# Patient Record
Sex: Female | Born: 1965 | Race: White | Hispanic: No | Marital: Married | Smoking: Never smoker
Health system: Southern US, Community
[De-identification: ages and names within clinical notes are randomized; demographics above are authoritative.]

## PROBLEM LIST (undated history)

## (undated) DIAGNOSIS — M009 Pyogenic arthritis, unspecified: Secondary | ICD-10-CM

## (undated) DIAGNOSIS — M19042 Primary osteoarthritis, left hand: Secondary | ICD-10-CM

## (undated) DIAGNOSIS — R6889 Other general symptoms and signs: Secondary | ICD-10-CM

## (undated) DIAGNOSIS — T8859XA Other complications of anesthesia, initial encounter: Secondary | ICD-10-CM

## (undated) DIAGNOSIS — T4145XA Adverse effect of unspecified anesthetic, initial encounter: Secondary | ICD-10-CM

## (undated) DIAGNOSIS — K219 Gastro-esophageal reflux disease without esophagitis: Secondary | ICD-10-CM

## (undated) DIAGNOSIS — M436 Torticollis: Secondary | ICD-10-CM

## (undated) HISTORY — PX: OTHER SURGICAL HISTORY: SHX169

---

## 1999-07-23 ENCOUNTER — Encounter (INDEPENDENT_AMBULATORY_CARE_PROVIDER_SITE_OTHER): Payer: Self-pay | Admitting: Specialist

## 1999-07-23 ENCOUNTER — Other Ambulatory Visit: Admission: RE | Admit: 1999-07-23 | Discharge: 1999-07-23 | Payer: Self-pay | Admitting: Obstetrics and Gynecology

## 2001-02-09 ENCOUNTER — Other Ambulatory Visit: Admission: RE | Admit: 2001-02-09 | Discharge: 2001-02-09 | Payer: Self-pay | Admitting: Obstetrics and Gynecology

## 2001-02-23 ENCOUNTER — Encounter: Payer: Self-pay | Admitting: Obstetrics and Gynecology

## 2001-02-23 ENCOUNTER — Ambulatory Visit (HOSPITAL_COMMUNITY): Admission: RE | Admit: 2001-02-23 | Discharge: 2001-02-23 | Payer: Self-pay | Admitting: Obstetrics and Gynecology

## 2001-04-21 ENCOUNTER — Encounter (INDEPENDENT_AMBULATORY_CARE_PROVIDER_SITE_OTHER): Payer: Self-pay

## 2001-04-21 ENCOUNTER — Ambulatory Visit (HOSPITAL_COMMUNITY): Admission: RE | Admit: 2001-04-21 | Discharge: 2001-04-21 | Payer: Self-pay | Admitting: Obstetrics and Gynecology

## 2002-02-24 ENCOUNTER — Other Ambulatory Visit: Admission: RE | Admit: 2002-02-24 | Discharge: 2002-02-24 | Payer: Self-pay | Admitting: Obstetrics and Gynecology

## 2003-02-27 ENCOUNTER — Other Ambulatory Visit: Admission: RE | Admit: 2003-02-27 | Discharge: 2003-02-27 | Payer: Self-pay | Admitting: Obstetrics and Gynecology

## 2004-03-07 ENCOUNTER — Other Ambulatory Visit: Admission: RE | Admit: 2004-03-07 | Discharge: 2004-03-07 | Payer: Self-pay | Admitting: Obstetrics and Gynecology

## 2004-03-19 ENCOUNTER — Ambulatory Visit (HOSPITAL_COMMUNITY): Admission: RE | Admit: 2004-03-19 | Discharge: 2004-03-19 | Payer: Self-pay | Admitting: Obstetrics and Gynecology

## 2005-03-17 ENCOUNTER — Other Ambulatory Visit: Admission: RE | Admit: 2005-03-17 | Discharge: 2005-03-17 | Payer: Self-pay | Admitting: Obstetrics and Gynecology

## 2005-03-20 ENCOUNTER — Ambulatory Visit (HOSPITAL_COMMUNITY): Admission: RE | Admit: 2005-03-20 | Discharge: 2005-03-20 | Payer: Self-pay | Admitting: Obstetrics and Gynecology

## 2005-05-09 ENCOUNTER — Encounter: Admission: RE | Admit: 2005-05-09 | Discharge: 2005-05-09 | Payer: Self-pay | Admitting: Obstetrics and Gynecology

## 2006-06-04 ENCOUNTER — Other Ambulatory Visit: Admission: RE | Admit: 2006-06-04 | Discharge: 2006-06-04 | Payer: Self-pay | Admitting: Obstetrics and Gynecology

## 2006-06-08 ENCOUNTER — Encounter: Admission: RE | Admit: 2006-06-08 | Discharge: 2006-06-08 | Payer: Self-pay | Admitting: Obstetrics and Gynecology

## 2007-06-11 ENCOUNTER — Encounter: Admission: RE | Admit: 2007-06-11 | Discharge: 2007-06-11 | Payer: Self-pay | Admitting: Obstetrics and Gynecology

## 2007-09-02 ENCOUNTER — Other Ambulatory Visit: Admission: RE | Admit: 2007-09-02 | Discharge: 2007-09-02 | Payer: Self-pay | Admitting: Obstetrics and Gynecology

## 2008-06-16 ENCOUNTER — Encounter: Admission: RE | Admit: 2008-06-16 | Discharge: 2008-06-16 | Payer: Self-pay | Admitting: Obstetrics and Gynecology

## 2008-09-05 ENCOUNTER — Other Ambulatory Visit: Admission: RE | Admit: 2008-09-05 | Discharge: 2008-09-05 | Payer: Self-pay | Admitting: Obstetrics and Gynecology

## 2009-07-09 ENCOUNTER — Encounter: Admission: RE | Admit: 2009-07-09 | Discharge: 2009-07-09 | Payer: Self-pay | Admitting: Obstetrics and Gynecology

## 2009-10-08 ENCOUNTER — Other Ambulatory Visit: Admission: RE | Admit: 2009-10-08 | Discharge: 2009-10-08 | Payer: Self-pay | Admitting: Obstetrics and Gynecology

## 2010-07-22 ENCOUNTER — Encounter: Admission: RE | Admit: 2010-07-22 | Discharge: 2010-07-22 | Payer: Self-pay | Admitting: Obstetrics and Gynecology

## 2011-03-14 NOTE — Op Note (Signed)
[facility]  Patient:    Ariana Baird, Ariana Baird                         MRN: [id number] Proc. Date: 04/21/01 Attending:  Beather Arbour. Thomasena Edis, M.D. CC:         [facility] Gynecology, [address]   Operative Report  PREOPERATIVE DIAGNOSIS:       Endometrial polyp found on sonohistogram.  POSTOPERATIVE DIAGNOSIS:      Endometrial polyp found on sonohistogram  OPERATION:                    Dilatation and curettage, hysteroscopy.  SURGEON:                      Beather Arbour. Thomasena Edis, M.D., Ph.D.  ANESTHESIA:                   MAC plus 10 cc 1% lidocaine paracervical block.  FLUIDS:                       Approximately 1000 cc of crystalloid.  DRAINS:                       None.  COMPLICATIONS:                None.   DESCRIPTION OF PROCEDURE:     The patient was brought to the operating room and identified on the operating table.  After the patient was adequately sedated, she was placed in the dorsolithotomy position and prepped and draped in the usual sterile fashion.  The bladder was straight catheterized for approximately  100 cc of clear yellow urine.  Examination under anesthesia revealed the uterus to be anteverted and mobile, approximately 6 to 8-week size.  There were no adnexal masses palpated.  A speculum was placed in the anterior lip of the cervix.  It was infiltrated with 1 cc of 1% lidocaine and grasped with a single-tooth tenaculum.  The remaining 9 cc of 1% lidocaine were infused for a paracervical block.  The uterus sounded to 7 cm.  The cervix was very gently dilated up to a #25 Pratt dilator.  Dilatation proceeded very carefully and gently to decrease the risk of uterine perforation.  Using the ACMI hysteroscope and using sorbitol as a distending medium, a careful and thorough hysteroscopic examination was performed.  The previously seen polyp on sonohistogram was again visualized.  Both tubal ostia were identified.  There was noted to be shaggy  endometrium as well.  At that point, the scope was withdrawn, and a careful and thorough curettage was performed with copious polypoid tissue obtained.  The Randall Stone forceps were placed, and additional tissue was obtained.  The hysteroscope was again placed, and there was noted to be one small additional area of polyp remaining, and this was subsequently removed in its entirety with the serrated curet and the Randall Stone forceps.  The hysteroscope was again placed, and the endometrium was noted to be curetted in its entirety with the polyp removed in its entirety.  The patient tolerated the procedure well without apparent complications.  She was transferred to the recovery room in stable condition after all instrument, sponge, and needle counts were correct.  She was given postoperative instruction sheet, urged to take ibuprofen 200 mg tablets every six hours as needed for pain, refrain from intercourse for  two weeks, and return to the office in two to three weeks for postoperative evaluation.  She is to call for any problems. DD:  04/21/01 TD:  04/21/01 Job: [id number]

## 2011-03-14 NOTE — Op Note (Signed)
[facility]  Patient:    Ariana Baird                          MRN: [id number] Proc. Date: 04/21/01 Attending:  Beather Arbour. Thomasena Edis, M.D. CC:         [facility] Gynecology, [address]   Operative Report  PREOPERATIVE DIAGNOSIS:       Endometrial polyp found on sonohistogram.  POSTOPERATIVE DIAGNOSIS:      Endometrial polyp found on sonohistogram  OPERATION:                    Dilatation and curettage, hysteroscopy.  SURGEON:                      Beather Arbour. Thomasena Edis, M.D., Ph.D.  ANESTHESIA:                   MAC plus 10 cc 1% lidocaine paracervical block.  FLUIDS:                       Approximately 1000 cc of crystalloid.  DRAINS:                       None.  COMPLICATIONS:                None.   DESCRIPTION OF PROCEDURE:     The patient was brought to the operating room and identified on the operating table.  After the patient was adequately sedated, she was placed in the dorsolithotomy position and prepped and draped in the usual sterile fashion.  The bladder was straight catheterized for approximately  100 cc of clear yellow urine.  Examination under anesthesia revealed the uterus to be anteverted and mobile, approximately 6 to 8-week size.  There were no adnexal masses palpated.  A speculum was placed in the anterior lip of the cervix.  It was infiltrated with 1 cc of 1% lidocaine and grasped with a single-tooth tenaculum.  The remaining 9 cc of 1% lidocaine were infused for a paracervical block.  The uterus sounded to 7 cm.  The cervix was very gently dilated up to a #25 Pratt dilator.  Dilatation proceeded very carefully and gently to decrease the risk of uterine perforation.  Using the ACMI hysteroscope and using sorbitol as a distending medium, a careful and thorough hysteroscopic examination was performed.  The previously seen polyp on sonohistogram was again visualized.  Both tubal ostia were identified.  There was noted to be shaggy  endometrium as well.  At that point, the scope was withdrawn, and a careful and thorough curettage was performed with copious polypoid tissue obtained.  The Randall Stone forceps were placed, and additional tissue was obtained.  The hysteroscope was again placed, and there was noted to be one small additional area of polyp remaining, and this was subsequently removed in its entirety with the serrated curet and the Randall Stone forceps.  The hysteroscope was again placed, and the endometrium was noted to be curetted in its entirety with the polyp removed in its entirety.  The patient tolerated the procedure well without apparent complications.  She was transferred to the recovery room in stable condition after all instrument, sponge, and needle counts were correct.  She was given postoperative instruction sheet, urged to take ibuprofen 200 mg tablets every six hours as needed for pain, refrain from  intercourse for two weeks, and return to the office in two to three weeks for postoperative evaluation.  She is to call for any problems. DD:  04/21/01 TD:  04/21/01 Job: [id number]

## 2011-03-14 NOTE — H&P (Signed)
[facility]  Patient:    Ariana Baird, Ariana Baird                         MRN: [id number] Attending:  Beather Arbour. Thomasena Edis, M.D. CC:         [facility] Gynecology, [address]  Preop Area, [facility] for surgery April 21, 2001.   History and Physical  DATE OF BIRTH:                01/07/1966  SOCIAL SECURITY NUMBER:       [id number]  HISTORY OF PRESENT ILLNESS:   The patient is a 45 year old gravida 4, para 27, Caucasian female who was seen for annual examination February 09, 2001.  She was complaining at that time of some left lower quadrant pain.  PAST MEDICAL HISTORY:         Significant for preterm delivery at 24 weeks with subsequent demise of the infant. DD:  04/21/01 TD:  04/21/01 Job: [id number]

## 2011-07-31 ENCOUNTER — Other Ambulatory Visit: Payer: Self-pay | Admitting: Obstetrics & Gynecology

## 2011-07-31 DIAGNOSIS — Z1231 Encounter for screening mammogram for malignant neoplasm of breast: Secondary | ICD-10-CM

## 2011-08-14 ENCOUNTER — Ambulatory Visit
Admission: RE | Admit: 2011-08-14 | Discharge: 2011-08-14 | Disposition: A | Discharge status: Home / Self Care | Payer: BC Managed Care – PPO | Source: Ambulatory Visit | Attending: Obstetrics & Gynecology | Admitting: Obstetrics & Gynecology

## 2011-08-14 DIAGNOSIS — Z1231 Encounter for screening mammogram for malignant neoplasm of breast: Secondary | ICD-10-CM

## 2013-06-23 ENCOUNTER — Other Ambulatory Visit: Payer: Self-pay

## 2013-06-23 DIAGNOSIS — Z1231 Encounter for screening mammogram for malignant neoplasm of breast: Secondary | ICD-10-CM

## 2013-07-13 ENCOUNTER — Ambulatory Visit
Admission: RE | Admit: 2013-07-13 | Discharge: 2013-07-13 | Disposition: A | Discharge status: Home / Self Care | Payer: BC Managed Care – PPO | Source: Ambulatory Visit

## 2013-07-13 DIAGNOSIS — Z1231 Encounter for screening mammogram for malignant neoplasm of breast: Secondary | ICD-10-CM

## 2014-06-21 ENCOUNTER — Other Ambulatory Visit: Payer: Self-pay

## 2014-06-21 DIAGNOSIS — Z1231 Encounter for screening mammogram for malignant neoplasm of breast: Secondary | ICD-10-CM

## 2014-07-14 ENCOUNTER — Ambulatory Visit
Admission: RE | Admit: 2014-07-14 | Discharge: 2014-07-14 | Disposition: A | Discharge status: Home / Self Care | Payer: BC Managed Care – PPO | Source: Ambulatory Visit

## 2014-07-14 DIAGNOSIS — Z1231 Encounter for screening mammogram for malignant neoplasm of breast: Secondary | ICD-10-CM

## 2015-09-12 ENCOUNTER — Other Ambulatory Visit: Payer: Self-pay

## 2015-09-12 DIAGNOSIS — Z1231 Encounter for screening mammogram for malignant neoplasm of breast: Secondary | ICD-10-CM

## 2015-09-29 ENCOUNTER — Emergency Department (HOSPITAL_BASED_OUTPATIENT_CLINIC_OR_DEPARTMENT_OTHER)
Admission: EM | Admit: 2015-09-29 | Discharge: 2015-09-29 | Disposition: A | Discharge status: Home / Self Care | Payer: BLUE CROSS/BLUE SHIELD | Attending: Emergency Medicine | Admitting: Emergency Medicine

## 2015-09-29 ENCOUNTER — Encounter (HOSPITAL_BASED_OUTPATIENT_CLINIC_OR_DEPARTMENT_OTHER): Payer: Self-pay | Admitting: Emergency Medicine

## 2015-09-29 DIAGNOSIS — Y9389 Activity, other specified: Secondary | ICD-10-CM | POA: Diagnosis not present

## 2015-09-29 DIAGNOSIS — S61211A Laceration without foreign body of left index finger without damage to nail, initial encounter: Secondary | ICD-10-CM | POA: Diagnosis present

## 2015-09-29 DIAGNOSIS — W268XXA Contact with other sharp object(s), not elsewhere classified, initial encounter: Secondary | ICD-10-CM | POA: Insufficient documentation

## 2015-09-29 DIAGNOSIS — S61219A Laceration without foreign body of unspecified finger without damage to nail, initial encounter: Secondary | ICD-10-CM

## 2015-09-29 DIAGNOSIS — Y9289 Other specified places as the place of occurrence of the external cause: Secondary | ICD-10-CM | POA: Diagnosis not present

## 2015-09-29 DIAGNOSIS — Y998 Other external cause status: Secondary | ICD-10-CM | POA: Insufficient documentation

## 2015-09-29 NOTE — ED Notes (Signed)
Patient washed finger after lacerating finger with soap and water

## 2015-09-29 NOTE — ED Notes (Signed)
Pt states she lacerated her left pointer finger with soup can lid

## 2015-09-29 NOTE — ED Provider Notes (Signed)
CSN: [id number]     Arrival date & time 09/29/15  1821 History   First MD Initiated Contact with Patient 09/29/15 1838     Chief Complaint  Patient presents with  . Laceration     (Consider location/radiation/quality/duration/timing/severity/associated sxs/prior Treatment) HPI  Ariana Baird is a 49 y.o. female  PCP: No primary care provider on file.  Blood pressure 167/100, pulse 90, temperature 99 F (37.2 C), temperature source Oral, resp. rate 18, height 5\' 6"  (1.676 m), weight 68.04 kg, last menstrual period 09/08/2015, SpO2 99 %.  SIGNIFICANT PMH: none CHIEF COMPLAINT: laceration  When: 3 pm today Mechanism: cut it on a soup can Chronicity: acute Location: left pointer finger Radiation: none Quality and severity: minor Treatments tried: pressure Alleviating factors: rest Worsening factors: movement Associated Symptoms: bleeding. Risk Factors: drinking alcohol.  Patient to the ER because she got a small cut on her finger, she came because she was concerned that it kept bleeding. She does not want stitches and informs that she plans to refuse them because otherwise she passes out.  Negative ROS: Confusion, diaphoresis, fever, headache, weakness (general or focal), change of vision,  neck pain, dysphagia, aphagia, chest pain, shortness of breath,  back pain, abdominal pains, nausea, vomiting, diarrhea, lower extremity swelling, rash.     History reviewed. No pertinent past medical history. History reviewed. No pertinent past surgical history. History reviewed. No pertinent family history. Social History  Substance Use Topics  . Smoking status: Never Smoker   . Smokeless tobacco: None  . Alcohol Use: Yes   OB History    No data available     Review of Systems  All other systems reviewed and are negative.     Allergies  Review of patient's allergies indicates no known allergies.  Home Medications   Prior to Admission medications   Not on File   BP  167/100 mmHg  Pulse 90  Temp(Src) 99 F (37.2 C) (Oral)  Resp 18  Ht 5\' 6"  (1.676 m)  Wt 68.04 kg  BMI 24.22 kg/m2  SpO2 99%  LMP 09/08/2015 Physical Exam  Constitutional: She appears well-developed and well-nourished. No distress.  HENT:  Head: Normocephalic and atraumatic.  Eyes: Pupils are equal, round, and reactive to light.  Neck: Normal range of motion. Neck supple.  Cardiovascular: Normal rate and regular rhythm.   Pulmonary/Chest: Effort normal.  Abdominal: Soft.  Neurological: She is alert.  Skin: Skin is warm and dry.  Small 0,5 cm laceration to posterior left pointer finger. It does not extend through the epidermis. NIV.  Nursing note and vitals reviewed.   ED Course  Procedures (including critical care time) Labs Review Labs Reviewed - No data to display  Imaging Review No results found. I have personally reviewed and evaluated these images and lab results as part of my medical decision-making.   EKG Interpretation None      MDM   Final diagnoses:  Laceration of finger of left hand, initial encounter    Patient does not want sutures, it is not through the entire epidermis. Explained to patient that the risk of non-suture is delayed healing, worse cosmesis, and that it may open due to being in an area of tension. She says that she prefers to not have a sutured stool. She is up-to-date on her tetanus. The wound was cleaned and Steri-Strips applied. The patient was placed in a finger splint to keep it in extension. Patient to follow-up with primary care doctor, given return precautions.  Medications - No data to display   I feel the patient has had an appropriate workup for their chief complaint at this time and likelihood of emergent condition existing is low. Discussed s/sx that warrant return to the ED.  Filed Vitals:   09/29/15 1827 09/29/15 1944  BP: 167/100 130/88  Pulse: 90 78  Temp: 99 F (37.2 C)   Resp: 18 [address],  PA-C 09/29/15 1948  Marily Memos, MD 09/30/15 1558

## 2015-09-29 NOTE — Discharge Instructions (Signed)
Nonsutured Laceration Care °A laceration is a cut that goes through all layers of the skin and extends into the tissue that is right under the skin. This type of cut is usually stitched up (sutured) or closed with tape (adhesive strips) or skin glue shortly after the injury happens. °However, if the wound is dirty or if several hours pass before medical treatment is provided, it is likely that germs (bacteria) will enter the wound. Closing a laceration after bacteria have entered it increases the risk of infection. In these cases, your health care provider may leave the laceration open (nonsutured) and cover it with a bandage. This type of treatment helps prevent infection and allows the wound to heal from the deepest layer of tissue damage up to the surface. °An open fracture is a type of injury that may involve nonsutured lacerations. An open fracture is a break in a bone that happens along with one or more lacerations through the skin that is near the fracture site. °HOW TO CARE FOR YOUR NONSUTURED LACERATION °· Take or apply over-the-counter and prescription medicines only as told by your health care provider. °· If you were prescribed an antibiotic medicine, take or apply it as told by your health care provider. Do not stop using the antibiotic even if your condition improves. °· Clean the wound one time each day or as told by your health care provider. °¨ Wash the wound with mild soap and water. °¨ Rinse the wound with water to remove all soap. °¨ Pat your wound dry with a clean towel. Do not rub the wound. °· Do not inject anything into the wound unless your health care provider told you to. °· Change any bandages (dressings) as told by your health care provider. This includes changing the dressing if it gets wet, dirty, or starts to smell bad. °· Keep the dressing dry until your health care provider says it can be removed. Do not take baths, swim, or do anything that puts your wound underwater until your  health care provider approves. °· Raise (elevate) the injured area above the level of your heart while you are sitting or lying down, if possible. °· Do not scratch or pick at the wound. °· Check your wound every day for signs of infection. Watch for: °¨ Redness, swelling, or pain. °¨ Fluid, blood, or pus. °· Keep all follow-up visits as told by your health care provider. This is important. °SEEK MEDICAL CARE IF: °· You received a tetanus and shot and you have swelling, severe pain, redness, or bleeding at the injection site.   °· You have a fever. °· Your pain is not controlled with medicine. °· You have increased redness, swelling, or pain at the site of your wound. °· You have fluid, blood, or pus coming from your wound. °· You notice a bad smell coming from your wound or your dressing. °· You notice something coming out of the wound, such as wood or glass. °· You notice a change in the color of your skin near your wound. °· You develop a new rash. °· You need to change the dressing frequently due to fluid, blood, or pus draining from the wound. °· You develop numbness around your wound. °SEEK IMMEDIATE MEDICAL CARE IF: °· Your pain suddenly increases and is severe. °· You develop severe swelling around the wound. °· The wound is on your hand or foot and you cannot properly move a finger or toe. °· The wound is on your hand or   foot and you notice that your fingers or toes look pale or bluish. °· You have a red streak going away from your wound. °  °This information is not intended to replace advice given to you by your health care provider. Make sure you discuss any questions you have with your health care provider. °  °Document Released: 09/10/2006 Document Revised: 02/27/2015 Document Reviewed: 10/09/2014 °Elsevier Interactive Patient Education ©2016 [employer] ° °

## 2015-10-03 ENCOUNTER — Other Ambulatory Visit: Payer: Self-pay | Admitting: Obstetrics and Gynecology

## 2015-10-03 ENCOUNTER — Ambulatory Visit
Admission: RE | Admit: 2015-10-03 | Discharge: 2015-10-03 | Disposition: A | Discharge status: Home / Self Care | Payer: BLUE CROSS/BLUE SHIELD | Source: Ambulatory Visit

## 2015-10-03 DIAGNOSIS — R928 Other abnormal and inconclusive findings on diagnostic imaging of breast: Secondary | ICD-10-CM

## 2015-10-03 DIAGNOSIS — Z1231 Encounter for screening mammogram for malignant neoplasm of breast: Secondary | ICD-10-CM

## 2015-10-10 ENCOUNTER — Ambulatory Visit
Admission: RE | Admit: 2015-10-10 | Discharge: 2015-10-10 | Disposition: A | Discharge status: Home / Self Care | Payer: BLUE CROSS/BLUE SHIELD | Source: Ambulatory Visit | Attending: Obstetrics and Gynecology | Admitting: Obstetrics and Gynecology

## 2015-10-10 DIAGNOSIS — R928 Other abnormal and inconclusive findings on diagnostic imaging of breast: Secondary | ICD-10-CM

## 2016-09-01 ENCOUNTER — Other Ambulatory Visit: Payer: Self-pay | Admitting: Obstetrics and Gynecology

## 2016-09-01 DIAGNOSIS — Z1231 Encounter for screening mammogram for malignant neoplasm of breast: Secondary | ICD-10-CM

## 2016-10-13 ENCOUNTER — Ambulatory Visit
Admission: RE | Admit: 2016-10-13 | Discharge: 2016-10-13 | Disposition: A | Discharge status: Home / Self Care | Payer: BLUE CROSS/BLUE SHIELD | Source: Ambulatory Visit | Attending: Obstetrics and Gynecology | Admitting: Obstetrics and Gynecology

## 2016-10-13 DIAGNOSIS — Z1231 Encounter for screening mammogram for malignant neoplasm of breast: Secondary | ICD-10-CM

## 2016-11-27 IMAGING — MG MM DIAG BREAST TOMO UNI LEFT
4 series · 4 of 12 positions shown · non-contrast
Comparison: Previous exam(s).

CLINICAL DATA: Left medial breast asymmetry seen on most recent
screening mammography.

EXAM:
DIGITAL DIAGNOSTIC LEFT MAMMOGRAM WITH 3D TOMOSYNTHESIS WITH CAD
ULTRASOUND LEFT BREAST

[L CC]
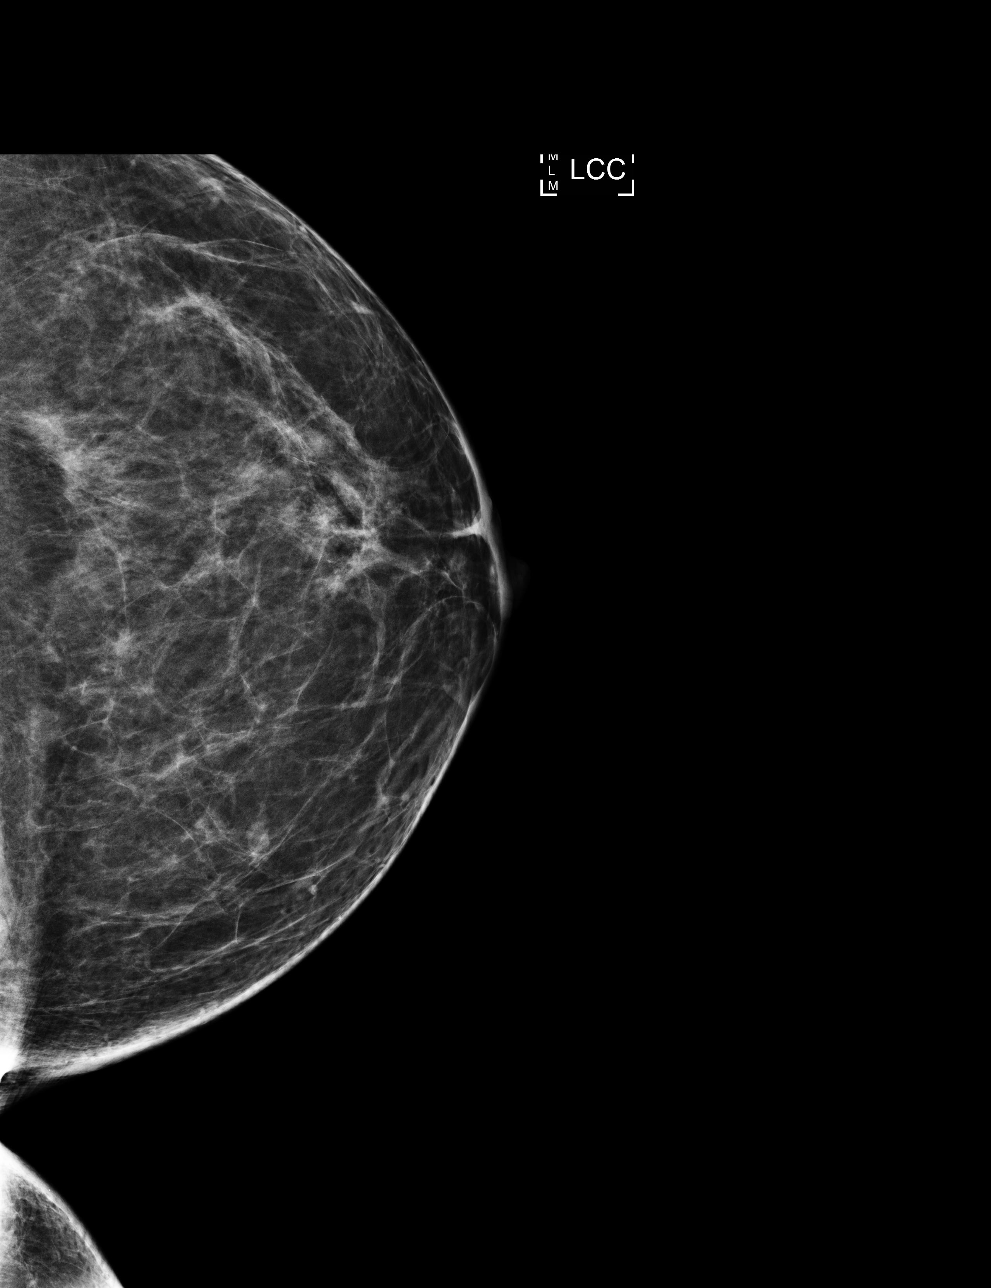

[L MLO]
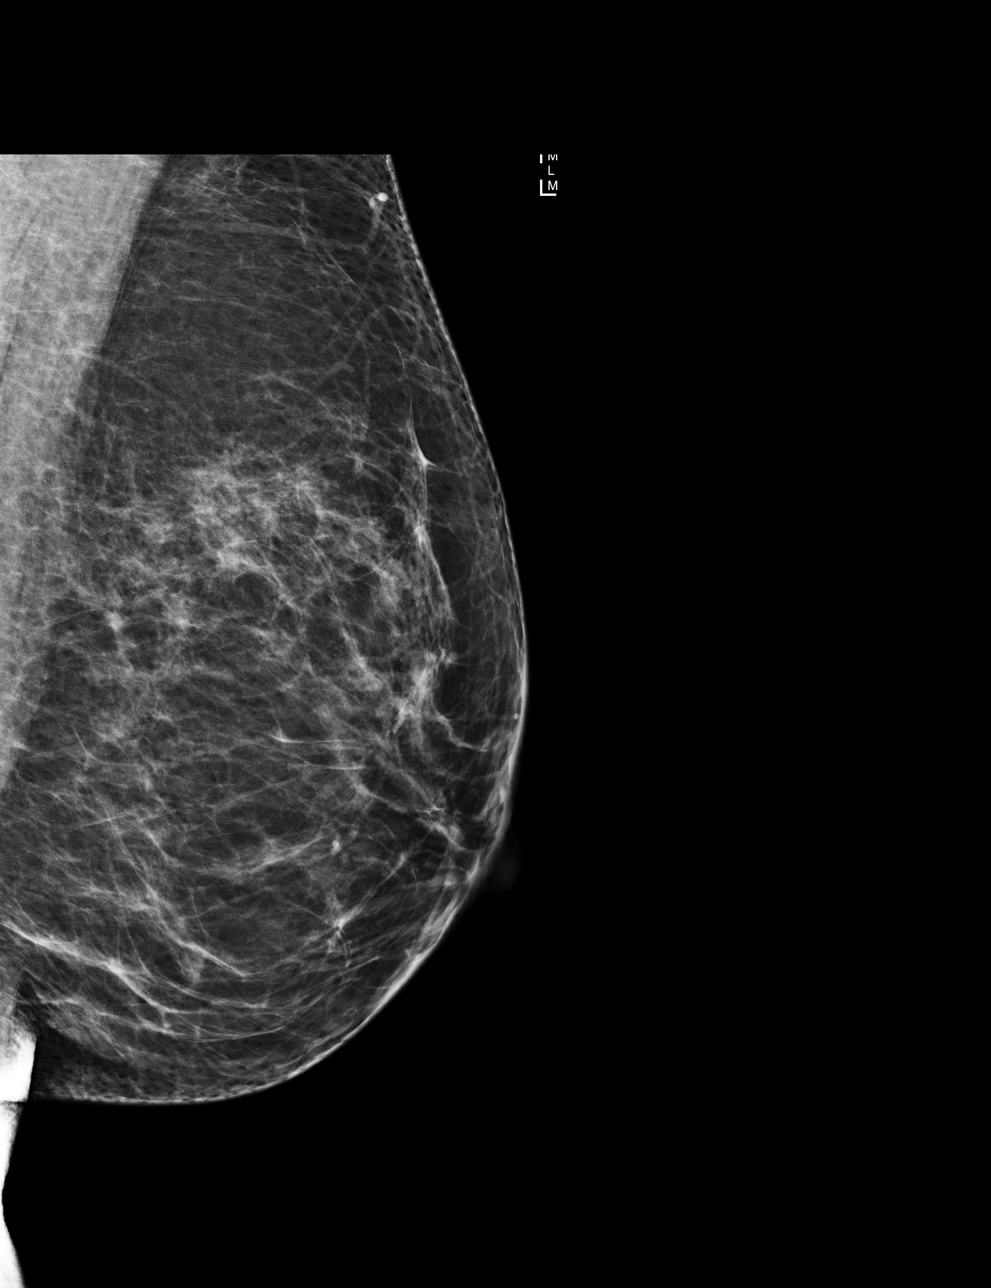

[L MLO tomo · tomo slice 37/73.0]
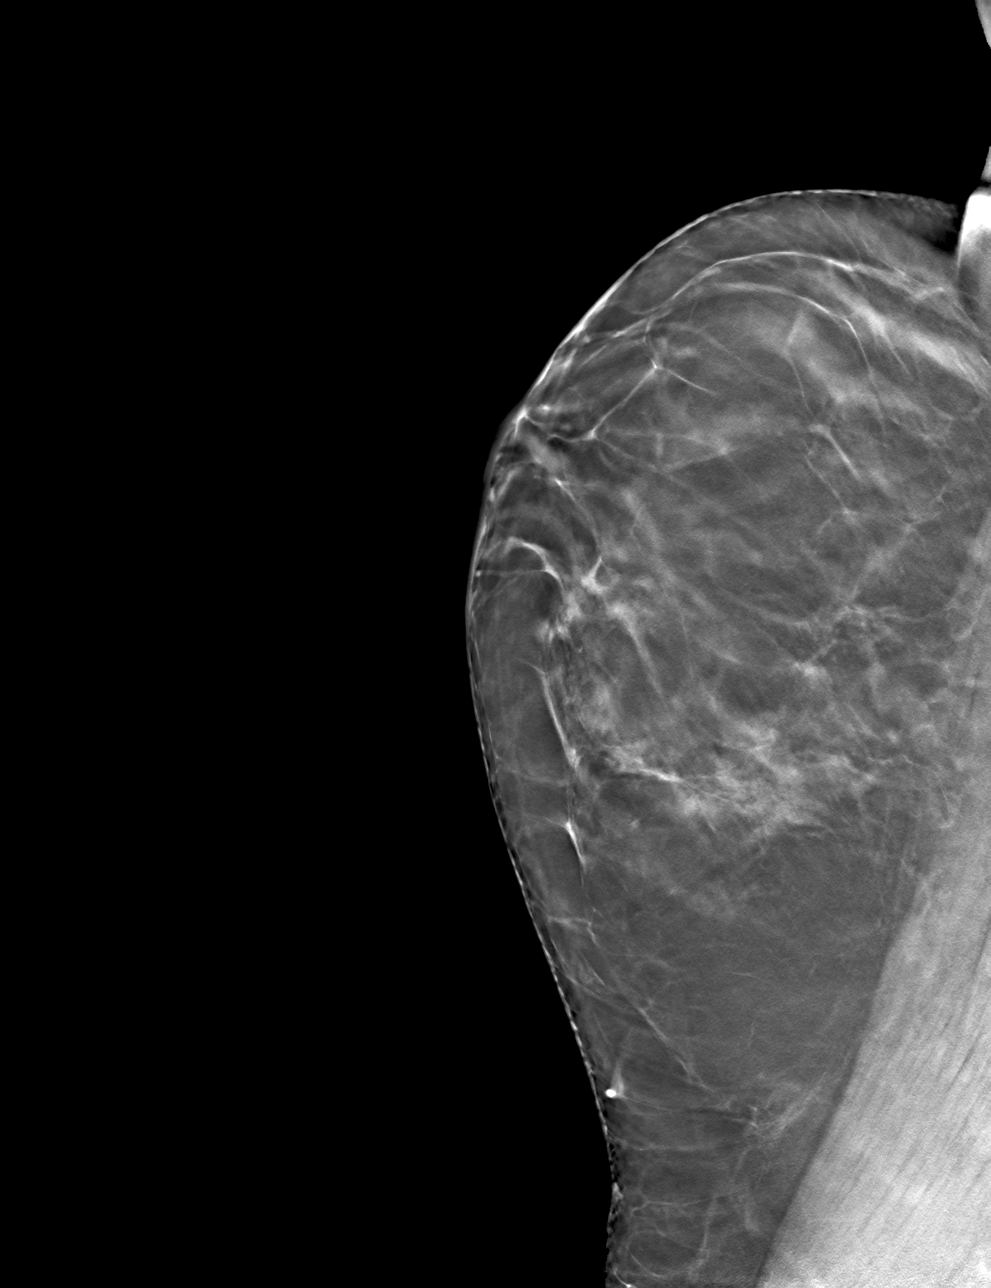

[L CC tomo · tomo slice 39/78.0]
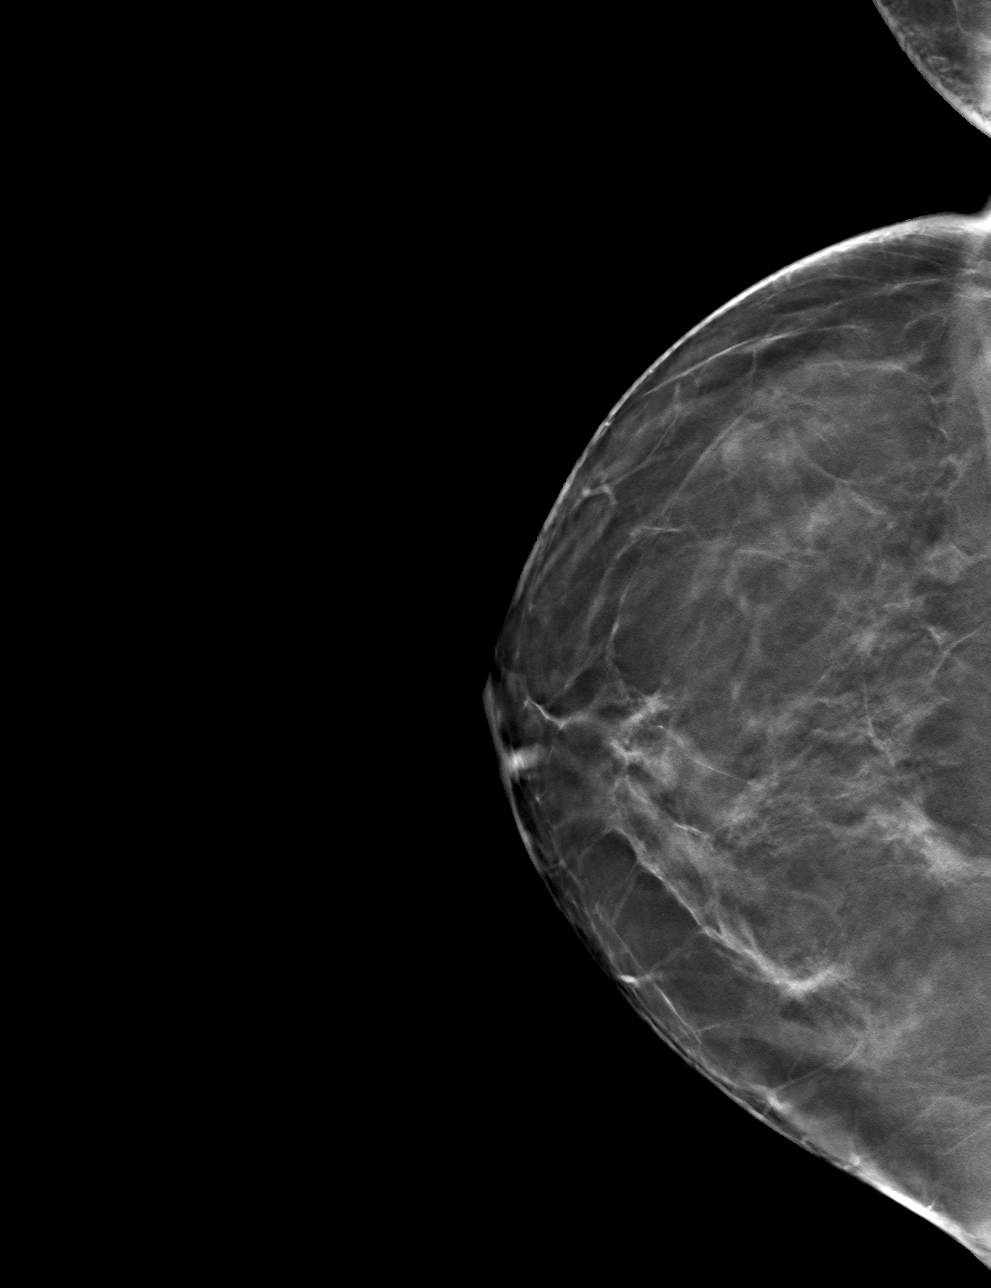

[4 of 12 positions shown; findings below may reference images not displayed]

ACR Breast Density Category b: There are scattered areas of
fibroglandular density.
FINDINGS: Additional mammographic views of the left breast demonstrate no
suspicious masses, areas of architectural distortion or
microcalcifications. The previously seen asymmetry in the medial
left breast effaces to glandular tissue.

Mammographic images were processed with CAD.

On physical exam, no suspicious masses are found.

Targeted ultrasound is performed, showing no suspicious masses or
shadowing lesions.
IMPRESSION: No mammographic or sonographic evidence of malignancy in the left
breast.

RECOMMENDATION:
Screening mammogram in one year.(Code:[id number])

I have discussed the findings and recommendations with the patient.
Results were also provided in writing at the conclusion of the
visit. If applicable, a reminder letter will be sent to the patient
regarding the next appointment.

BI-RADS CATEGORY  1: Negative.

## 2019-01-12 NOTE — Progress Notes (Signed)
Need orders for 3-23 surgery in epic 

## 2019-01-12 NOTE — Patient Instructions (Signed)
Maliaka Slevin  01/12/2019   Your procedure is scheduled on: 01-17-2019  Report to [facility] Main  Entrance  Report to  SHORT STAYat 530 AM    Call this number if you have problems the morning of surgery [phone]   Remember: Do not eat food or drink liquids :After Midnight. BRUSH YOUR TEETH MORNING OF SURGERY AND RINSE YOUR MOUTH OUT, NO CHEWING GUM CANDY OR MINTS.     Take these medicines the morning of surgery with A SIP OF WATER: NONE                                You may not have any metal on your body including hair pins and              piercings  Do not wear jewelry, make-up, lotions, powders or perfumes, deodorant             Do not wear nail polish.  Do not shave  48 hours prior to surgery.                Do not bring valuables to the hospital. [facility] IS NOT             RESPONSIBLE   FOR VALUABLES.  Contacts, dentures or bridgework may not be worn into surgery.  Leave suitcase in the car. After surgery it may be brought to your room.                  Please read over the following fact sheets you were given: _____________________________________________________________________             [facility] - Preparing for Surgery Before surgery, you can play an important role.  Because skin is not sterile, your skin needs to be as free of germs as possible.  You can reduce the number of germs on your skin by washing with CHG (chlorahexidine gluconate) soap before surgery.  CHG is an antiseptic cleaner which kills germs and bonds with the skin to continue killing germs even after washing. Please DO NOT use if you have an allergy to CHG or antibacterial soaps.  If your skin becomes reddened/irritated stop using the CHG and inform your nurse when you arrive at Short Stay. Do not shave (including legs and underarms) for at least 48 hours prior to the first CHG shower.  You may shave your face/neck. Please follow these instructions carefully:  1.   Shower with CHG Soap the night before surgery and the  morning of Surgery.  2.  If you choose to wash your hair, wash your hair first as usual with your  normal  shampoo.  3.  After you shampoo, rinse your hair and body thoroughly to remove the  shampoo.                           4.  Use CHG as you would any other liquid soap.  You can apply chg directly  to the skin and wash                       Gently with a scrungie or clean washcloth.  5.  Apply the CHG Soap to your body ONLY FROM THE NECK DOWN.   Do not use on  face/ open                           Wound or open sores. Avoid contact with eyes, ears mouth and genitals (private parts).                       Wash face,  Genitals (private parts) with your normal soap.             6.  Wash thoroughly, paying special attention to the area where your surgery  will be performed.  7.  Thoroughly rinse your body with warm water from the neck down.  8.  DO NOT shower/wash with your normal soap after using and rinsing off  the CHG Soap.                9.  Pat yourself dry with a clean towel.            10.  Wear clean pajamas.            11.  Place clean sheets on your bed the night of your first shower and do not  sleep with pets. Day of Surgery : Do not apply any lotions/deodorants the morning of surgery.  Please wear clean clothes to the hospital/surgery center.  FAILURE TO FOLLOW THESE INSTRUCTIONS MAY RESULT IN THE CANCELLATION OF YOUR SURGERY PATIENT SIGNATURE_________________________________  NURSE SIGNATURE__________________________________  ________________________________________________________________________   Adam Phenix  An incentive spirometer is a tool that can help keep your lungs clear and active. This tool measures how well you are filling your lungs with each breath. Taking long deep breaths may help reverse or decrease the chance of developing breathing (pulmonary) problems (especially infection) following:  A long  period of time when you are unable to move or be active. BEFORE THE PROCEDURE   If the spirometer includes an indicator to show your best effort, your nurse or respiratory therapist will set it to a desired goal.  If possible, sit up straight or lean slightly forward. Try not to slouch.  Hold the incentive spirometer in an upright position. INSTRUCTIONS FOR USE  1. Sit on the edge of your bed if possible, or sit up as far as you can in bed or on a chair. 2. Hold the incentive spirometer in an upright position. 3. Breathe out normally. 4. Place the mouthpiece in your mouth and seal your lips tightly around it. 5. Breathe in slowly and as deeply as possible, raising the piston or the ball toward the top of the column. 6. Hold your breath for 3-5 seconds or for as long as possible. Allow the piston or ball to fall to the bottom of the column. 7. Remove the mouthpiece from your mouth and breathe out normally. 8. Rest for a few seconds and repeat Steps 1 through 7 at least 10 times every 1-2 hours when you are awake. Take your time and take a few normal breaths between deep breaths. 9. The spirometer may include an indicator to show your best effort. Use the indicator as a goal to work toward during each repetition. 10. After each set of 10 deep breaths, practice coughing to be sure your lungs are clear. If you have an incision (the cut made at the time of surgery), support your incision when coughing by placing a pillow or rolled up towels firmly against it. Once you are able to get out of bed, walk around indoors  and cough well. You may stop using the incentive spirometer when instructed by your caregiver.  RISKS AND COMPLICATIONS  Take your time so you do not get dizzy or light-headed.  If you are in pain, you may need to take or ask for pain medication before doing incentive spirometry. It is harder to take a deep breath if you are having pain. AFTER USE  Rest and breathe slowly and  easily.  It can be helpful to keep track of a log of your progress. Your caregiver can provide you with a simple table to help with this. If you are using the spirometer at home, follow these instructions: SEEK MEDICAL CARE IF:   You are having difficultly using the spirometer.  You have trouble using the spirometer as often as instructed.  Your pain medication is not giving enough relief while using the spirometer.  You develop fever of 100.5 F (38.1 C) or higher. SEEK IMMEDIATE MEDICAL CARE IF:   You cough up bloody sputum that had not been present before.  You develop fever of 102 F (38.9 C) or greater.  You develop worsening pain at or near the incision site. MAKE SURE YOU:   Understand these instructions.  Will watch your condition.  Will get help right away if you are not doing well or get worse. Document Released: 02/23/2007 Document Revised: 01/05/2012 Document Reviewed: 04/26/2007 [facility] Patient Information 2014 [place], [place].   ________________________________________________________________________

## 2019-01-13 ENCOUNTER — Encounter (HOSPITAL_COMMUNITY): Payer: Self-pay

## 2019-01-13 ENCOUNTER — Ambulatory Visit (INDEPENDENT_AMBULATORY_CARE_PROVIDER_SITE_OTHER): Payer: BLUE CROSS/BLUE SHIELD | Admitting: Internal Medicine

## 2019-01-13 ENCOUNTER — Encounter (HOSPITAL_COMMUNITY)
Admission: RE | Admit: 2019-01-13 | Discharge: 2019-01-13 | Disposition: A | Discharge status: Home / Self Care | Payer: BLUE CROSS/BLUE SHIELD | Source: Ambulatory Visit | Attending: Orthopedic Surgery | Admitting: Orthopedic Surgery

## 2019-01-13 ENCOUNTER — Encounter: Payer: Self-pay | Admitting: Internal Medicine

## 2019-01-13 ENCOUNTER — Other Ambulatory Visit: Payer: Self-pay

## 2019-01-13 DIAGNOSIS — M65861 Other synovitis and tenosynovitis, right lower leg: Secondary | ICD-10-CM | POA: Diagnosis not present

## 2019-01-13 DIAGNOSIS — M1990 Unspecified osteoarthritis, unspecified site: Secondary | ICD-10-CM | POA: Insufficient documentation

## 2019-01-13 DIAGNOSIS — Z01812 Encounter for preprocedural laboratory examination: Secondary | ICD-10-CM | POA: Insufficient documentation

## 2019-01-13 HISTORY — DX: Torticollis: M43.6

## 2019-01-13 HISTORY — DX: Other general symptoms and signs: R68.89

## 2019-01-13 HISTORY — DX: Primary osteoarthritis, left hand: M19.042

## 2019-01-13 HISTORY — DX: Other complications of anesthesia, initial encounter: T88.59XA

## 2019-01-13 HISTORY — DX: Pyogenic arthritis, unspecified: M00.9

## 2019-01-13 HISTORY — DX: Adverse effect of unspecified anesthetic, initial encounter: T41.45XA

## 2019-01-13 HISTORY — DX: Gastro-esophageal reflux disease without esophagitis: K21.9

## 2019-01-13 LAB — COMPREHENSIVE METABOLIC PANEL
ALT: 20 U/L (ref 0–44)
AST: 19 U/L (ref 15–41)
Albumin: 4.1 g/dL (ref 3.5–5.0)
Alkaline Phosphatase: 81 U/L (ref 38–126)
Anion gap: 9 (ref 5–15)
BUN: 11 mg/dL (ref 6–20)
CO2: 26 mmol/L (ref 22–32)
CREATININE: 0.63 mg/dL (ref 0.44–1.00)
Calcium: 9.1 mg/dL (ref 8.9–10.3)
Chloride: 102 mmol/L (ref 98–111)
GFR calc Af Amer: >60 mL/min (ref >60)
GFR calc non Af Amer: >60 mL/min (ref >60)
Glucose, Bld: 106 mg/dL — ABNORMAL HIGH (ref 70–99)
Potassium: 3.7 mmol/L (ref 3.5–5.1)
Sodium: 137 mmol/L (ref 135–145)
Total Bilirubin: 0.4 mg/dL (ref 0.3–1.2)
Total Protein: 7.7 g/dL (ref 6.5–8.1)

## 2019-01-13 LAB — CBC
HCT: 38 % (ref 36.0–46.0)
Hemoglobin: 12.2 g/dL (ref 12.0–15.0)
MCH: 30.1 pg (ref 26.0–34.0)
MCHC: 32.1 g/dL (ref 30.0–36.0)
MCV: 93.8 fL (ref 80.0–100.0)
PLATELETS: 325 10*3/uL (ref 150–400)
RBC: 4.05 MIL/uL (ref 3.87–5.11)
RDW: 12.5 % (ref 11.5–15.5)
WBC: 8.6 10*3/uL (ref 4.0–10.5)
nRBC: 0 % (ref 0.0–0.2)

## 2019-01-14 NOTE — Progress Notes (Signed)
Anesthesia Chart Review   Case:  [id number] Date/Time:  01/17/19 0700   Procedure:  Right knee arthrotomy; synovectomy (Right ) -   Anesthesia type:  Choice   Pre-op diagnosis:  Right knee synovitis; possible septic arthritis   Location:  [facility] / [facility]   Surgeon:  Ollen Gross, MD      DISCUSSION: 53 yo never smoker with h/o GERD, right knee synovitis, possible septic arthritis scheduled for above procedure 01/17/19 with Dr. Ollen Gross.   Pt seen by rheumatology, Isabelle Course, PA-C, for right knee effusion, treated for inflammatory arthritis with MTX without improvement.  Pt seen by ID, Dr. Staci Righter, 01/13/19.  Per his note culture pending and will start antibiotics per results.    Pt can proceed with planned procedure barring acute status change.  VS: BP (!) 156/76   Pulse 86   Temp 36.6 C (Oral)   Resp 18   Ht 5\' 6"  (1.676 m)   Wt (!) 184 kg   LMP 09/08/2015   SpO2 100%   BMI 65.47 kg/m   PROVIDERS: Loyal Jacobson, MD is PCP   Staci Righter, MD with Infectious Disease LABS: Labs reviewed: Acceptable for surgery. (all labs ordered are listed, but only abnormal results are displayed)  Labs Reviewed  COMPREHENSIVE METABOLIC PANEL - Abnormal; Notable for the following components:      Result Value   Glucose, Bld 106 (*)    All other components within normal limits  CBC     IMAGES:   EKG:   CV:  Past Medical History:  Diagnosis Date  . Arthritis of left hand    swelling no pain or drainage x 3 months  . Complication of anesthesia   . GERD (gastroesophageal reflux disease)   . Neck complaint    right neck swollen area  no drainage x 3 months  . Septic arthritis (HCC)    synrovotis  . Stiff neck    x 3 months    Past Surgical History:  Procedure Laterality Date  . arthroscopic right knee surgery'    . laproscopic ovarian cyst surgery    . left elbow surgery      MEDICATIONS: . acetaminophen (TYLENOL) 500 MG tablet  .  Cholecalciferol (VITAMIN D-3) 125 MCG (5000 UT) TABS  . Glucosamine-Chondroitin (COSAMIN DS PO)  . ibuprofen (ADVIL,MOTRIN) 200 MG tablet  . Magnesium 250 MG TABS  . Omega-3 Fatty Acids (FISH OIL PO)  . Turmeric Curcumin 500 MG CAPS   No current facility-administered medications for this encounter.     Janey Genta [facility] Pre-Surgical Testing [phone] 01/14/19 1:51 PM

## 2019-01-14 NOTE — Progress Notes (Signed)
[facility]      Reason for Consult: abnormal labs with no absolute findings for infection    Referring Physician: Dr. Lequita Halt    Patient ID: Ariana Baird, female    DOB: 1965-11-22, 53 y.o.   MRN: [id number]  HPI:   She is her for evaluation of above.   She has multiple complaints regarding joints, particularly pain in right knee with swelling.  Over 1 year ago she had jarred her knee while walking her dog and has had pain and swelling since that time and a meniscal tear. She initially underwent right knee arthroscopy by Dr. [place] at [facility] for partial medial minescectomy and biocartilage enhancement on 01/25/2018.  She continued to have pain and swelling and underwent right knee arthroscopy for lysis of adhesions and lateral release in June 2019.  She continued to have pain and swelling and in February, she sought an opinion from Dr. Lequita Halt. She had a knee aspiration in his office in February with 50,220 WBCs and 91% neutrophils and culture remained negative. In March, she had another aspiration with 49,000 WBCs and a neutrophil predominance and also negative bacterial cultures.  An alpha-defensins was also sent and was positive of unknown significance.   She also has had issues with her right shoulder with pain with movement and a soft fat pad that developed in the area and her left wrist.  She was diagnosed with Tommi Rumps Quervain's disease.  She had been on prednisone and methotrexate under the care of a rheumatologist, though inflammatory arthritis markers/RA factor screen was negative.  She did not notice any improvement with  Previous record reviewed in Epic/CareEverywhere and partially summarized above.   Past Medical History:  Diagnosis Date  . Arthritis of left hand    swelling no pain or drainage x 3 months  . Complication of anesthesia   . GERD (gastroesophageal reflux disease)   . Neck complaint    right neck swollen area  no drainage x 3 months  . Septic  arthritis (HCC)    synrovotis  . Stiff neck    x 3 months    Prior to Admission medications   Medication Sig Start Date End Date Taking? Authorizing Provider  acetaminophen (TYLENOL) 500 MG tablet Take 1,000 mg by mouth every 6 (six) hours as needed (for pain.).   Yes [provider]  Cholecalciferol (VITAMIN D-3) 125 MCG (5000 UT) TABS Take 5,000 Units by mouth daily.    [provider]  Glucosamine-Chondroitin (COSAMIN DS PO) Take 1 tablet by mouth daily.    [provider]  ibuprofen (ADVIL,MOTRIN) 200 MG tablet Take 600 mg by mouth every 8 (eight) hours as needed (for pain.).    [provider]  Magnesium 250 MG TABS Take 250 mg by mouth daily.    [provider]  Omega-3 Fatty Acids (FISH OIL PO) Take 720 mg by mouth daily.    [provider]  Turmeric Curcumin 500 MG CAPS Take 500 mg by mouth daily.    [provider]    Allergies  Allergen Reactions  . Percocet [Oxycodone-Acetaminophen] Itching  . Enoxaparin Rash    lovenox    Social History   Tobacco Use  . Smoking status: Never Smoker  . Smokeless tobacco: Never Used  Substance Use Topics  . Alcohol use: Yes    Comment: occ  . Drug use: Never   FMH: no arthritis, no SLE  Review of Systems  Constitutional: negative for  fevers and chills Gastrointestinal: negative for nausea and diarrhea Integument/breast: negative for rash Hematologic/lymphatic: negative for lymphadenopathy All other systems reviewed and are negative    Constitutional: in no apparent distress  Vitals:   01/13/19 1450  BP: 137/87  Pulse: 98  Temp: 98.3 F (36.8 C)   EYES: anicteric ENMT: no thrush Musculoskeletal: + mild right knee swelling, mild warmth Right shoulder with no warmth, soft, non-tender area over clavicle; left wrist with warmth Skin: no rashes Neuro: non-focal Psych: normal affect   Labs: Lab Results  Component Value Date   WBC 8.6 01/13/2019   HGB 12.2  01/13/2019   HCT 38.0 01/13/2019   MCV 93.8 01/13/2019   PLT 325 01/13/2019    Lab Results  Component Value Date   CREATININE 0.63 01/13/2019   BUN 11 01/13/2019   NA 137 01/13/2019   K 3.7 01/13/2019   CL 102 01/13/2019   CO2 26 01/13/2019    Lab Results  Component Value Date   ALT 20 01/13/2019   AST 19 01/13/2019   ALKPHOS 81 01/13/2019   BILITOT 0.4 01/13/2019     Assessment: inflammatory arthritis.  Low likelihood of infection with no positive cultures, moderate amount of WBCs, prolonged course of inflammation and no worsening on prednisone and methotrexate and associated with other joint swelling.  There is though no definitive test to rule out infection so I would certainly send the fluid again for studies during the surgery.   Plan: 1) synovial fluid analysis for WBCs, culture including bacterial, AFB and fungal cultures for completeness.   Will consider antibiotic treatment for any concerns.

## 2019-01-14 NOTE — Anesthesia Preprocedure Evaluation (Addendum)
Anesthesia Evaluation  Patient identified by MRN, date of birth, ID band  Reviewed: Allergy & Precautions, NPO status , Patient's Chart, lab work & pertinent test results  Airway Mallampati: II  TM Distance: >3 FB Neck ROM: Full    Dental   Pulmonary neg pulmonary ROS,    Pulmonary exam normal breath sounds clear to auscultation       Cardiovascular negative cardio ROS Normal cardiovascular exam Rhythm:Regular Rate:Normal     Neuro/Psych negative neurological ROS     GI/Hepatic Neg liver ROS, GERD  ,  Endo/Other  negative endocrine ROS  Renal/GU negative Renal ROS     Musculoskeletal  (+) Arthritis ,   Abdominal   Peds  Hematology negative hematology ROS (+)   Anesthesia Other Findings Day of surgery medications reviewed with the patient.  Reproductive/Obstetrics                            Anesthesia Physical Anesthesia Plan  ASA: II  Anesthesia Plan: General   Post-op Pain Management: GA combined w/ [facility] for post-op pain   Induction: Intravenous  PONV Risk Score and Plan: 4 or greater and Ondansetron, Dexamethasone, Midazolam and Treatment may vary due to age or medical condition  Airway Management Planned: LMA  Additional Equipment: None  Intra-op Plan:   Post-operative Plan: Extubation in OR  Informed Consent: I have reviewed the patients History and Physical, chart, labs and discussed the procedure including the risks, benefits and alternatives for the proposed anesthesia with the patient or authorized representative who has indicated his/her understanding and acceptance.     Dental advisory given  Plan Discussed with: CRNA  Anesthesia Plan Comments: (See PAT note 01/13/19, Jodell Cipro, PA-C)      Anesthesia Quick Evaluation

## 2019-01-16 NOTE — H&P (Signed)
CC- Ariana Baird is a 53 y.o. female who presents with right knee pain.  HPI- . Knee Pain: Patient presents with knee pain involving the  right knee. Onset of the symptoms was several months ago. Inciting event: SHe has had 2 arthroscopic procedures on her knee with persistent worseing pain and swelling. She has developed arthritic changes in the knee. The swelling has persisted and she has had an aspiration x 2 with results equivocal for possible infection, Her synovial WBC count has been high with a high percentage of neutrophils but her cultures were negative. She has been treated by a rheumatologist, Dr Corliss Skains, for inflammatory arthropathy with methotrexate but it has not helped her swelling. She is going to require a knee replacement in the future but it is not going to be done yet with the possibility of infection. She presents for synovectomy with biopsy and culture with infectioud diease consult for their guidance on treatment.   Past Medical History:  Diagnosis Date  . Arthritis of left hand    swelling no pain or drainage x 3 months  . Complication of anesthesia   . GERD (gastroesophageal reflux disease)   . Neck complaint    right neck swollen area  no drainage x 3 months  . Septic arthritis (HCC)    synrovotis  . Stiff neck    x 3 months    Past Surgical History:  Procedure Laterality Date  . arthroscopic right knee surgery'    . laproscopic ovarian cyst surgery    . left elbow surgery      Prior to Admission medications   Medication Sig Start Date End Date Taking? Authorizing Provider  acetaminophen (TYLENOL) 500 MG tablet Take 1,000 mg by mouth every 6 (six) hours as needed (for pain.).   Yes [provider]  Cholecalciferol (VITAMIN D-3) 125 MCG (5000 UT) TABS Take 5,000 Units by mouth daily.   Yes [provider]  Glucosamine-Chondroitin (COSAMIN DS PO) Take 1 tablet by mouth daily.   Yes [provider]  ibuprofen (ADVIL,MOTRIN) 200 MG  tablet Take 600 mg by mouth every 8 (eight) hours as needed (for pain.).   Yes [provider]  Magnesium 250 MG TABS Take 250 mg by mouth daily.   Yes [provider]  Omega-3 Fatty Acids (FISH OIL PO) Take 720 mg by mouth daily.   Yes [provider]  Turmeric Curcumin 500 MG CAPS Take 500 mg by mouth daily.   Yes [provider]   KNEE EXAM antalgic gait, soft tissue tenderness over medial knee, effusion, collateral ligaments intact  Physical Examination: General appearance - alert, well appearing, and in no distress Mental status - alert, oriented to person, place, and time Chest - clear to auscultation, no wheezes, rales or rhonchi, symmetric air entry Heart - normal rate, regular rhythm, normal S1, S2, no murmurs, rubs, clicks or gallops Abdomen - soft, nontender, nondistended, no masses or organomegaly Neurological - alert, oriented, normal speech, no focal findings or movement disorder noted   Asessment/Plan--- Right knee possible septic arthritis- - Plan right knee synovectomy. Procedure risks and potential comps discussed with patient who elects to proceed.

## 2019-01-17 ENCOUNTER — Encounter (HOSPITAL_COMMUNITY): Payer: Self-pay

## 2019-01-17 ENCOUNTER — Ambulatory Visit (HOSPITAL_COMMUNITY): Payer: BLUE CROSS/BLUE SHIELD | Admitting: Anesthesiology

## 2019-01-17 ENCOUNTER — Encounter (HOSPITAL_COMMUNITY)
Admission: RE | Disposition: A | Discharge status: Home / Self Care | Payer: Self-pay | Source: Home / Self Care | Attending: Orthopedic Surgery

## 2019-01-17 ENCOUNTER — Other Ambulatory Visit: Payer: Self-pay

## 2019-01-17 ENCOUNTER — Observation Stay (HOSPITAL_COMMUNITY)
Admission: RE | Admit: 2019-01-17 | Discharge: 2019-01-17 | Disposition: A | Discharge status: Home / Self Care | Payer: BLUE CROSS/BLUE SHIELD | Attending: Orthopedic Surgery | Admitting: Orthopedic Surgery

## 2019-01-17 DIAGNOSIS — M659 Synovitis and tenosynovitis, unspecified: Principal | ICD-10-CM | POA: Insufficient documentation

## 2019-01-17 DIAGNOSIS — Z79899 Other long term (current) drug therapy: Secondary | ICD-10-CM | POA: Diagnosis not present

## 2019-01-17 DIAGNOSIS — M1711 Unilateral primary osteoarthritis, right knee: Secondary | ICD-10-CM | POA: Diagnosis not present

## 2019-01-17 DIAGNOSIS — M65969 Unspecified synovitis and tenosynovitis, unspecified lower leg: Secondary | ICD-10-CM

## 2019-01-17 DIAGNOSIS — M25561 Pain in right knee: Secondary | ICD-10-CM | POA: Diagnosis present

## 2019-01-17 HISTORY — PX: KNEE ARTHROTOMY: SHX5881

## 2019-01-17 LAB — SYNOVIAL CELL COUNT + DIFF, W/ CRYSTALS
Crystals, Fluid: NONE SEEN
Lymphocytes-Synovial Fld: 2 % (ref 0–20)
Monocyte-Macrophage-Synovial Fluid: 3 % — ABNORMAL LOW (ref 50–90)
Neutrophil, Synovial: 95 % — ABNORMAL HIGH (ref 0–25)
WBC, Synovial: 47560 /mm3 — ABNORMAL HIGH (ref 0–200)

## 2019-01-17 SURGERY — ARTHROTOMY, KNEE
Anesthesia: General | Site: Knee | Laterality: Right

## 2019-01-17 MED ORDER — LIDOCAINE 2% (20 MG/ML) 5 ML SYRINGE
INTRAMUSCULAR | Status: DC | PRN
  Administered 2019-01-17: 60 mg via INTRAVENOUS

## 2019-01-17 MED ORDER — SODIUM CHLORIDE 0.9 % IV SOLN
INTRAVENOUS | Status: DC
  Administered 2019-01-17: 11:00:00 via INTRAVENOUS

## 2019-01-17 MED ORDER — FENTANYL CITRATE (PF) 100 MCG/2ML IJ SOLN
INTRAMUSCULAR | Status: DC | PRN
  Administered 2019-01-17: 25 ug via INTRAVENOUS
  Administered 2019-01-17: 50 ug via INTRAVENOUS
  Administered 2019-01-17: 25 ug via INTRAVENOUS

## 2019-01-17 MED ORDER — DOCUSATE SODIUM 100 MG PO CAPS: 100.0000 mg | ORAL_CAPSULE | Freq: Two times a day (BID) | ORAL | Status: DC

## 2019-01-17 MED ORDER — CEFAZOLIN SODIUM-DEXTROSE 2-4 GM/100ML-% IV SOLN
2.0000 g | INTRAVENOUS | Status: AC
  Administered 2019-01-17: 2 g via INTRAVENOUS
  Filled 2019-01-17: qty 100

## 2019-01-17 MED ORDER — CHLORHEXIDINE GLUCONATE 4 % EX LIQD: 60.0000 mL | Freq: Once | CUTANEOUS | Status: DC

## 2019-01-17 MED ORDER — PROMETHAZINE HCL 25 MG/ML IJ SOLN: 6.2500 mg | INTRAMUSCULAR | Status: DC | PRN

## 2019-01-17 MED ORDER — ACETAMINOPHEN 10 MG/ML IV SOLN
1000.0000 mg | Freq: Four times a day (QID) | INTRAVENOUS | Status: DC
  Administered 2019-01-17: 1000 mg via INTRAVENOUS
  Filled 2019-01-17: qty 100

## 2019-01-17 MED ORDER — PROPOFOL 10 MG/ML IV BOLUS
INTRAVENOUS | Status: AC
  Filled 2019-01-17: qty 20

## 2019-01-17 MED ORDER — HYDROCODONE-ACETAMINOPHEN 7.5-325 MG PO TABS: 1.0000 | ORAL_TABLET | Freq: Once | ORAL | Status: DC | PRN

## 2019-01-17 MED ORDER — ONDANSETRON HCL 4 MG/2ML IJ SOLN: 4.0000 mg | Freq: Four times a day (QID) | INTRAMUSCULAR | Status: DC | PRN

## 2019-01-17 MED ORDER — HYDROCODONE-ACETAMINOPHEN 5-325 MG PO TABS
1.0000 | ORAL_TABLET | ORAL | Status: DC | PRN
  Administered 2019-01-17 (×2): 2 via ORAL
  Filled 2019-01-17 (×2): qty 2

## 2019-01-17 MED ORDER — ONDANSETRON HCL 4 MG/2ML IJ SOLN
INTRAMUSCULAR | Status: DC | PRN
  Administered 2019-01-17: 4 mg via INTRAVENOUS

## 2019-01-17 MED ORDER — LACTATED RINGERS IV SOLN
INTRAVENOUS | Status: DC
  Administered 2019-01-17: 07:00:00 via INTRAVENOUS

## 2019-01-17 MED ORDER — METOCLOPRAMIDE HCL 5 MG PO TABS: 5.0000 mg | ORAL_TABLET | Freq: Three times a day (TID) | ORAL | Status: DC | PRN

## 2019-01-17 MED ORDER — PROPOFOL 10 MG/ML IV BOLUS
INTRAVENOUS | Status: AC
  Filled 2019-01-17: qty 40

## 2019-01-17 MED ORDER — METHOCARBAMOL 500 MG PO TABS: 500.0000 mg | ORAL_TABLET | Freq: Four times a day (QID) | ORAL | 0 refills | Status: AC | PRN

## 2019-01-17 MED ORDER — METHOCARBAMOL 500 MG IVPB - SIMPLE MED
INTRAVENOUS | Status: AC
  Administered 2019-01-17: 500 mg via INTRAVENOUS
  Filled 2019-01-17: qty 50

## 2019-01-17 MED ORDER — FENTANYL CITRATE (PF) 100 MCG/2ML IJ SOLN
INTRAMUSCULAR | Status: AC
  Administered 2019-01-17: 50 ug via INTRAVENOUS
  Filled 2019-01-17: qty 4

## 2019-01-17 MED ORDER — POVIDONE-IODINE 10 % EX SWAB: 2.0000 "application " | Freq: Once | CUTANEOUS | Status: DC

## 2019-01-17 MED ORDER — ONDANSETRON HCL 4 MG PO TABS: 4.0000 mg | ORAL_TABLET | Freq: Four times a day (QID) | ORAL | Status: DC | PRN

## 2019-01-17 MED ORDER — DEXAMETHASONE SODIUM PHOSPHATE 10 MG/ML IJ SOLN
INTRAMUSCULAR | Status: AC
  Filled 2019-01-17: qty 1

## 2019-01-17 MED ORDER — METHOCARBAMOL 500 MG IVPB - SIMPLE MED
500.0000 mg | Freq: Four times a day (QID) | INTRAVENOUS | Status: DC | PRN
  Administered 2019-01-17: 500 mg via INTRAVENOUS
  Filled 2019-01-17: qty 50

## 2019-01-17 MED ORDER — ROPIVACAINE HCL 7.5 MG/ML IJ SOLN
INTRAMUSCULAR | Status: DC | PRN
  Administered 2019-01-17: 20 mL via PERINEURAL

## 2019-01-17 MED ORDER — HYDROCODONE-ACETAMINOPHEN 5-325 MG PO TABS: 1.0000 | ORAL_TABLET | Freq: Four times a day (QID) | ORAL | 0 refills | Status: AC | PRN

## 2019-01-17 MED ORDER — BUPIVACAINE-EPINEPHRINE (PF) 0.25% -1:200000 IJ SOLN
INTRAMUSCULAR | Status: AC
  Filled 2019-01-17: qty 30

## 2019-01-17 MED ORDER — ACETAMINOPHEN 500 MG PO TABS: 500.0000 mg | ORAL_TABLET | Freq: Four times a day (QID) | ORAL | Status: DC

## 2019-01-17 MED ORDER — CEFAZOLIN SODIUM-DEXTROSE 1-4 GM/50ML-% IV SOLN
1.0000 g | Freq: Four times a day (QID) | INTRAVENOUS | Status: DC
  Filled 2019-01-17 (×2): qty 50

## 2019-01-17 MED ORDER — MIDAZOLAM HCL 2 MG/2ML IJ SOLN
INTRAMUSCULAR | Status: DC | PRN
  Administered 2019-01-17 (×2): 1 mg via INTRAVENOUS

## 2019-01-17 MED ORDER — MORPHINE SULFATE (PF) 2 MG/ML IV SOLN: 0.5000 mg | INTRAVENOUS | Status: DC | PRN

## 2019-01-17 MED ORDER — METHOCARBAMOL 500 MG PO TABS: 500.0000 mg | ORAL_TABLET | Freq: Four times a day (QID) | ORAL | Status: DC | PRN

## 2019-01-17 MED ORDER — TRAMADOL HCL 50 MG PO TABS: 50.0000 mg | ORAL_TABLET | Freq: Four times a day (QID) | ORAL | 0 refills | Status: AC | PRN

## 2019-01-17 MED ORDER — SCOPOLAMINE 1 MG/3DAYS TD PT72
MEDICATED_PATCH | TRANSDERMAL | Status: DC | PRN
  Administered 2019-01-17: 1 via TRANSDERMAL

## 2019-01-17 MED ORDER — SCOPOLAMINE 1 MG/3DAYS TD PT72
MEDICATED_PATCH | TRANSDERMAL | Status: AC
  Filled 2019-01-17: qty 1

## 2019-01-17 MED ORDER — FENTANYL CITRATE (PF) 100 MCG/2ML IJ SOLN
INTRAMUSCULAR | Status: AC
  Filled 2019-01-17: qty 2

## 2019-01-17 MED ORDER — ONDANSETRON HCL 4 MG/2ML IJ SOLN
INTRAMUSCULAR | Status: AC
  Filled 2019-01-17: qty 2

## 2019-01-17 MED ORDER — FENTANYL CITRATE (PF) 100 MCG/2ML IJ SOLN
25.0000 ug | INTRAMUSCULAR | Status: DC | PRN
  Administered 2019-01-17 (×3): 50 ug via INTRAVENOUS

## 2019-01-17 MED ORDER — DEXAMETHASONE SODIUM PHOSPHATE 10 MG/ML IJ SOLN
INTRAMUSCULAR | Status: DC | PRN
  Administered 2019-01-17: 8 mg via INTRAVENOUS

## 2019-01-17 MED ORDER — ASPIRIN EC 325 MG PO TBEC: 325.0000 mg | DELAYED_RELEASE_TABLET | Freq: Every day | ORAL | 0 refills | Status: AC

## 2019-01-17 MED ORDER — CLONIDINE HCL (ANALGESIA) 100 MCG/ML EP SOLN
EPIDURAL | Status: DC | PRN
  Administered 2019-01-17: 50 ug

## 2019-01-17 MED ORDER — MIDAZOLAM HCL 2 MG/2ML IJ SOLN
INTRAMUSCULAR | Status: AC
  Filled 2019-01-17: qty 2

## 2019-01-17 MED ORDER — METOCLOPRAMIDE HCL 5 MG/ML IJ SOLN: 5.0000 mg | Freq: Three times a day (TID) | INTRAMUSCULAR | Status: DC | PRN

## 2019-01-17 MED ORDER — PROPOFOL 10 MG/ML IV BOLUS
INTRAVENOUS | Status: DC | PRN
  Administered 2019-01-17: 170 ug via INTRAVENOUS

## 2019-01-17 MED ORDER — 0.9 % SODIUM CHLORIDE (POUR BTL) OPTIME
TOPICAL | Status: DC | PRN
  Administered 2019-01-17: 1000 mL

## 2019-01-17 MED ORDER — LIDOCAINE 2% (20 MG/ML) 5 ML SYRINGE
INTRAMUSCULAR | Status: AC
  Filled 2019-01-17: qty 5

## 2019-01-17 MED ORDER — TRAMADOL HCL 50 MG PO TABS: 50.0000 mg | ORAL_TABLET | Freq: Four times a day (QID) | ORAL | Status: DC | PRN

## 2019-01-17 MED ORDER — MEPERIDINE HCL 50 MG/ML IJ SOLN: 6.2500 mg | INTRAMUSCULAR | Status: DC | PRN

## 2019-01-17 MED ORDER — BUPIVACAINE HCL 0.25 % IJ SOLN
INTRAMUSCULAR | Status: DC | PRN
  Administered 2019-01-17: 30 mL

## 2019-01-17 SURGICAL SUPPLY — 42 items
BAG SPEC THK2 15X12 ZIP CLS (MISCELLANEOUS) ×1
BAG ZIPLOCK 12X15 (MISCELLANEOUS) ×3 IMPLANT
BANDAGE ACE 6X5 VEL STRL LF (GAUZE/BANDAGES/DRESSINGS) ×3 IMPLANT
BANDAGE ELASTIC 6 VELCRO ST LF (GAUZE/BANDAGES/DRESSINGS) ×3 IMPLANT
BANDAGE ESMARK 6X9 LF (GAUZE/BANDAGES/DRESSINGS) ×1 IMPLANT
BNDG CMPR 9X6 STRL LF SNTH (GAUZE/BANDAGES/DRESSINGS) ×1
BNDG ESMARK 6X9 LF (GAUZE/BANDAGES/DRESSINGS) ×3
CHLORAPREP W/TINT 26 (MISCELLANEOUS) ×3 IMPLANT
CLOSURE WOUND 1/2 X4 (GAUZE/BANDAGES/DRESSINGS) ×1
COVER SURGICAL LIGHT HANDLE (MISCELLANEOUS) ×3 IMPLANT
COVER WAND RF STERILE (DRAPES) IMPLANT
CUFF TOURN SGL QUICK 34 (TOURNIQUET CUFF)
CUFF TRNQT CYL 34X4.125X (TOURNIQUET CUFF) IMPLANT
DRAPE INCISE IOBAN 66X45 STRL (DRAPES) ×3 IMPLANT
DRAPE U-SHAPE 47X51 STRL (DRAPES) ×3 IMPLANT
DRSG ADAPTIC 3X8 NADH LF (GAUZE/BANDAGES/DRESSINGS) ×3 IMPLANT
DRSG PAD ABDOMINAL 8X10 ST (GAUZE/BANDAGES/DRESSINGS) ×3 IMPLANT
ELECT REM PT RETURN 15FT ADLT (MISCELLANEOUS) ×3 IMPLANT
EVACUATOR 1/8 PVC DRAIN (DRAIN) IMPLANT
GAUZE SPONGE 4X4 12PLY STRL (GAUZE/BANDAGES/DRESSINGS) ×3 IMPLANT
GLOVE BIO SURGEON STRL SZ7 (GLOVE) ×3 IMPLANT
GLOVE BIO SURGEON STRL SZ7.5 (GLOVE) ×3 IMPLANT
GLOVE BIO SURGEON STRL SZ8 (GLOVE) ×3 IMPLANT
GLOVE BIOGEL PI IND STRL 7.0 (GLOVE) ×1 IMPLANT
GLOVE BIOGEL PI IND STRL 8 (GLOVE) ×3 IMPLANT
GLOVE BIOGEL PI INDICATOR 7.0 (GLOVE) ×2
GLOVE BIOGEL PI INDICATOR 8 (GLOVE) ×6
GOWN STRL REUS W/TWL LRG LVL3 (GOWN DISPOSABLE) ×9 IMPLANT
KIT BASIN OR (CUSTOM PROCEDURE TRAY) ×3 IMPLANT
KIT TURNOVER KIT A (KITS) IMPLANT
MANIFOLD NEPTUNE II (INSTRUMENTS) ×3 IMPLANT
PACK ORTHO EXTREMITY (CUSTOM PROCEDURE TRAY) ×3 IMPLANT
PADDING CAST COTTON 6X4 STRL (CAST SUPPLIES) ×3 IMPLANT
PROTECTOR NERVE ULNAR (MISCELLANEOUS) ×3 IMPLANT
STRIP CLOSURE SKIN 1/2X4 (GAUZE/BANDAGES/DRESSINGS) ×2 IMPLANT
SUT MNCRL AB 4-0 PS2 18 (SUTURE) ×3 IMPLANT
SUT STRATAFIX 0 PDS 27 VIOLET (SUTURE) ×3
SUT VIC AB 2-0 CT1 27 (SUTURE) ×4
SUT VIC AB 2-0 CT1 TAPERPNT 27 (SUTURE) ×2 IMPLANT
SUTURE STRATFX 0 PDS 27 VIOLET (SUTURE) ×1 IMPLANT
TOWEL OR 17X26 10 PK STRL BLUE (TOWEL DISPOSABLE) ×6 IMPLANT
WRAP KNEE MAXI GEL POST OP (GAUZE/BANDAGES/DRESSINGS) ×3 IMPLANT

## 2019-01-17 NOTE — Brief Op Note (Signed)
01/17/2019  8:02 AM  PATIENT:  Ariana Baird  53 y.o. female  PRE-OPERATIVE DIAGNOSIS:  Right knee synovitis; possible septic arthritis  POST-OPERATIVE DIAGNOSIS:  Right knee synovitis; possible septic arthritis  PROCEDURE:  Procedure(s) with comments: Right knee arthrotomy; synovectomy (Right) -  SURGEON:  Surgeon(s) and Role:    Ollen Gross, MD - Primary  PHYSICIAN ASSISTANT:   ASSISTANTS: Arther Abbott, PA-C   ANESTHESIA:   Adductor canal block and general  EBL:  25 mL   BLOOD ADMINISTERED:none  DRAINS: (Medium) Hemovact drain(s) in the right knee with  Suction Open   LOCAL MEDICATIONS USED:  MARCAINE     COUNTS:  YES  TOURNIQUET: 12 minutes @ 300 mm HG  DICTATION: .Other Dictation: Dictation Number [id number]  PLAN OF CARE: Admit for overnight observation  PATIENT DISPOSITION:  PACU - hemodynamically stable.

## 2019-01-17 NOTE — Anesthesia Postprocedure Evaluation (Signed)
Anesthesia Post Note  Patient: Nilda Krolczyk  Procedure(s) Performed: Right knee arthrotomy; synovectomy (Right Knee)     Patient location during evaluation: PACU Anesthesia Type: General Level of consciousness: sedated and patient cooperative Pain management: pain level controlled Vital Signs Assessment: post-procedure vital signs reviewed and stable Respiratory status: spontaneous breathing Cardiovascular status: stable Anesthetic complications: no    Last Vitals:  Vitals:   01/17/19 1137 01/17/19 1236  BP: 115/67 120/66  Pulse: 76 72  Resp: 17 17  Temp: 36.7 C 36.7 C  SpO2: 100% 100%    Last Pain:  Vitals:   01/17/19 1236  TempSrc: Oral  PainSc:                  Lewie Loron

## 2019-01-17 NOTE — Discharge Instructions (Addendum)
Dr. Ollen Gross [facility] [address], [address] [place], [place] [address] [phone]  POSTOPERATIVE DIRECTIONS  Knee Rehabilitation, Guidelines Following Surgery  Results after knee surgery are often greatly improved when you follow the exercise, range of motion and muscle strengthening exercises prescribed by your doctor. Safety measures are also important to protect the knee from further injury. Any time any of these exercises cause you to have increased pain or swelling in your knee joint, decrease the amount until you are comfortable again and slowly increase them. If you have problems or questions, call your caregiver or physical therapist for advice.    HOME CARE INSTRUCTIONS   Remove items at home which could result in a fall. This includes throw rugs or furniture in walking pathways.   ICE to the affected knee every three hours for 30 minutes at a time and then as needed for pain and swelling.  Continue to use ice on the knee for pain and swelling from surgery. You may notice swelling that will progress down to the foot and ankle.  This is normal after surgery.  Elevate the leg when you are not up walking on it.    Continue to use the breathing machine which will help keep your temperature down.  It is common for your temperature to cycle up and down following surgery, especially at night when you are not up moving around and exerting yourself.  The breathing machine keeps your lungs expanded and your temperature down.  Do not place pillow under knee, focus on keeping the knee straight while resting  DIET You may resume your previous home diet once your are discharged from the hospital.  DRESSING / WOUND CARE / SHOWERING You may change your dressing 3-5 days after surgery.  Then change the dressing every day with sterile gauze.  Please use good hand washing techniques before changing the dressing.  Do not use any lotions or creams on the  incision until instructed by your surgeon. You may start showering once you are discharged home but do not submerge the incision under water. Just pat the incision dry and apply a dry gauze dressing on daily. Change the surgical dressing daily and reapply a dry dressing each time.  ACTIVITY Walk with your walker as instructed. Use walker as long as suggested by your caregivers. Avoid periods of inactivity such as sitting longer than an hour when not asleep. This helps prevent blood clots.  You may resume a sexual relationship in one month or when given the OK by your doctor.  You may return to work once you are cleared by your doctor.  Do not drive a car for 6 weeks or until released by you surgeon.  Do not drive while taking narcotics.  WEIGHT BEARING Weight bearing as tolerated with assist device (walker, cane, etc) as directed, use it as long as suggested by your surgeon or therapist, typically at least 4-6 weeks.  POSTOPERATIVE CONSTIPATION PROTOCOL Constipation - defined medically as fewer than three stools per week and severe constipation as less than one stool per week.  One of the most common issues patients have following surgery is constipation.  Even if you have a regular bowel pattern at home, your normal regimen is likely to be disrupted due to multiple reasons following surgery.  Combination of anesthesia, postoperative narcotics, change in appetite and fluid intake all can affect your bowels.  In order to avoid complications following surgery, here are some recommendations in  order to help you during your recovery period.  Colace (docusate) - Pick up an over-the-counter form of Colace or another stool softener and take twice a day as long as you are requiring postoperative pain medications.  Take with a full glass of water daily.  If you experience loose stools or diarrhea, hold the colace until you stool forms back up.  If your symptoms do not get better within 1 week or if they  get worse, check with your doctor.  Dulcolax (bisacodyl) - Pick up over-the-counter and take as directed by the product packaging as needed to assist with the movement of your bowels.  Take with a full glass of water.  Use this product as needed if not relieved by Colace only.   MiraLax (polyethylene glycol) - Pick up over-the-counter to have on hand.  MiraLax is a solution that will increase the amount of water in your bowels to assist with bowel movements.  Take as directed and can mix with a glass of water, juice, soda, coffee, or tea.  Take if you go more than two days without a movement. Do not use MiraLax more than once per day. Call your doctor if you are still constipated or irregular after using this medication for 7 days in a row.  If you continue to have problems with postoperative constipation, please contact the office for further assistance and recommendations.  If you experience "the worst abdominal pain ever" or develop nausea or vomiting, please contact the office immediatly for further recommendations for treatment.  ITCHING  If you experience itching with your medications, try taking only a single pain pill, or even half a pain pill at a time.  You can also use Benadryl over the counter for itching or also to help with sleep.   TED HOSE STOCKINGS Wear the elastic stockings on both legs for three weeks following surgery during the day but you may remove then at night for sleeping.  MEDICATIONS See your medication summary on the After Visit Summary that the nursing staff will review with you prior to discharge.  You may have some home medications which will be placed on hold until you complete the course of blood thinner medication.  It is important for you to complete the blood thinner medication as prescribed by your surgeon.  Continue your approved medications as instructed at time of discharge.  PRECAUTIONS If you experience chest pain or shortness of breath - call 911  immediately for transfer to the hospital emergency department.  If you develop a fever greater that 101 F, purulent drainage from wound, increased redness or drainage from wound, foul odor from the wound/dressing, or calf pain - CONTACT YOUR SURGEON.                                                   FOLLOW-UP APPOINTMENTS Make sure you keep all of your appointments after your operation with your surgeon and caregivers. You should call the office at the above phone number and make an appointment for approximately two weeks after the date of your surgery or on the date instructed by your surgeon outlined in the "After Visit Summary".  IF YOU ARE TRANSFERRED TO A SKILLED REHAB FACILITY If the patient is transferred to a skilled rehab facility following release from the hospital, a list of the current medications will  be sent to the facility for the patient to continue.  When discharged from the skilled rehab facility, please have the facility set up the patient's Home Health Physical Therapy prior to being released. Also, the skilled facility will be responsible for providing the patient with their medications at time of release from the facility to include their pain medication, the muscle relaxants, and their blood thinner medication. If the patient is still at the rehab facility at time of the two week follow up appointment, the skilled rehab facility will also need to assist the patient in arranging follow up appointment in our office and any transportation needs.  MAKE SURE YOU:   Understand these instructions.   Get help right away if you are not doing well or get worse.    Pick up stool softner and laxative for home use following surgery while on pain medications. Do not submerge incision under water. Please use good hand washing techniques while changing dressing each day. May shower starting three days after surgery. Please use a clean towel to pat the incision dry following  showers. Continue to use ice for pain and swelling after surgery. Do not use any lotions or creams on the incision until instructed by your surgeon.

## 2019-01-17 NOTE — Transfer of Care (Signed)
Immediate Anesthesia Transfer of Care Note  Patient: Ariana Baird  Procedure(s) Performed: Right knee arthrotomy; synovectomy (Right Knee)  Patient Location: PACU  Anesthesia Type:General  Level of Consciousness: awake, alert  and oriented  Airway & Oxygen Therapy: Patient Spontanous Breathing and Patient connected to face mask oxygen  Post-op Assessment: Report given to RN and Post -op Vital signs reviewed and stable  Post vital signs: Reviewed and stable  Last Vitals:  Vitals Value Taken Time  BP 105/46 01/17/2019  8:25 AM  Temp    Pulse 82 01/17/2019  8:28 AM  Resp 16 01/17/2019  8:28 AM  SpO2 100 % 01/17/2019  8:28 AM  Vitals shown include unvalidated device data.  Last Pain:  Vitals:   01/17/19 0609  TempSrc:   PainSc: 6       Patients Stated Pain Goal: 5 (01/17/19 8338)  Complications: No apparent anesthesia complications

## 2019-01-17 NOTE — Op Note (Signed)
NAMEANALYAH, STUTTS MEDICAL RECORD [id number] ACCOUNT [id number] DATE OF BIRTH:1965/10/31 FACILITY: [facility] LOCATION: [facility] PHYSICIAN:Audric Venn Dulcy Fanny, MD  OPERATIVE REPORT  DATE OF PROCEDURE:  01/17/2019  PREOPERATIVE DIAGNOSIS:  Right knee synovitis with possible septic arthritis.  POSTOPERATIVE DIAGNOSIS:  Right knee synovitis with possible septic arthritis.  PROCEDURE:  Right knee arthrotomy with synovectomy.  SURGEON:  Ollen Gross, MD  ASSISTANT:  Arther Abbott, PA-C  ANESTHESIA:  Adductor canal block and general.  ESTIMATED BLOOD LOSS:  Minimal.  DRAINS:  Hemovac x1.  TOURNIQUET TIME:  Approximately 12 minutes at 300 mmHg.  COMPLICATIONS:  None.  CONDITION:  Stable to recovery.  BRIEF CLINICAL NOTE:  The patient is a 53 year old female with complex history in regards to her right knee.  She has had 2 arthroscopic procedures at an outside institution.  I saw her in second opinion several weeks ago.  I was concerned because of  recurrent swelling and progressive arthritis that she potentially had a septic knee.  She had an aspiration x2 with equivocal results with a high white blood cell count and high neutrophils, but negative culture.  She is going to be a candidate for knee  replacement, but given the fact that we cannot completely rule out infection, I felt that we needed a more substantial preoperative lab work.  She presents now for a synovectomy with cultures and pathologic analysis.  She also had an infectious disease  consult.  PROCEDURE IN DETAIL:  After successful administration of adductor canal block and general anesthetic, tourniquet was placed high on her right thigh.  Right lower extremity prepped and draped in the usual sterile fashion.  Extremity was wrapped in Esmarch  and tourniquet inflated to 300 mmHg.  Midline incision was made with a 10 blade through subcutaneous tissue to the extensor mechanism.  She had a very large effusion and I  aspirated about 30 mL of fluid to send for a cell count and differential.  A  fresh blade was then used to make a medial arthrotomy and the rest of the fluid was sent for Gram stain, aerobic and anaerobic culture, AFB culture, fungal culture.  Mini arthrotomy was made.  There was a lot of hypertrophic synovial tissue, but it did  not deep red synovium.  It is more of a degenerative synovium.  There is also some fibrinous material in the joint.  I debrided the fibrinous material.  I sent 2 specimens of synovium, 1 for culture and 1 to pathology for pathologic analysis.  A  synovectomy was then performed in the suprapatellar area, medial and lateral gutters.  I did not go posteriorly because I did not want to disrupt the meniscal structures or ligaments.  I did flex the knee and she had a significant amount of degenerative  cartilage in the medial and lateral compartments.  The joint was then thoroughly irrigated with saline, approximately 500 mL.  The arthrotomy was then closed over a Hemovac drain with a running 0 Stratafix suture.  The tourniquet was released.  Total  time approximately 12 minutes.  Minor bleeding was stopped with electrocautery.  Subcutaneous was closed with interrupted 2-0 Vicryl and subcuticular running 4-0 Monocryl.  The incision was cleaned and dried and Steri-Strips and a bulky sterile dressing  applied.    She was then awakened and transported to recovery in stable condition.  AN/NUANCE  D:01/17/2019 T:01/17/2019 JOB:[id number]

## 2019-01-17 NOTE — Anesthesia Procedure Notes (Signed)
Anesthesia Regional Block: Adductor canal block   Pre-Anesthetic Checklist: ,, timeout performed, Correct Patient, Correct Site, Correct Laterality, Correct Procedure, Correct Position, site marked, Risks and benefits discussed,  Surgical consent,  Pre-op evaluation,  At surgeon's request and post-op pain management  Laterality: Right  Prep: chloraprep       Needles:  Injection technique: Single-shot  Needle Type: Stimiplex     Needle Length: 9cm  Needle Gauge: 21     Additional Needles:   Procedures:,,,, ultrasound used (permanent image in chart),,,,  Narrative:  Start time: 01/17/2019 7:08 AM End time: 01/17/2019 7:10 AM Injection made incrementally with aspirations every 5 mL.  Performed by: Personally  Anesthesiologist: Lewie Loron, MD  Additional Notes: BP cuff, EKG monitors applied. Sedation begun. Artery and nerve location verified with U/S and anesthetic injected incrementally, slowly, and after negative aspirations under direct u/s guidance. Good fascial /perineural spread. Tolerated well.

## 2019-01-17 NOTE — Interval H&P Note (Signed)
History and Physical Interval Note:  01/17/2019 6:25 AM  Ariana Baird  has presented today for surgery, with the diagnosis of Right knee synovitis; possible septic arthritis.  The various methods of treatment have been discussed with the patient and family. After consideration of risks, benefits and other options for treatment, the patient has consented to  Procedure(s) with comments: Right knee arthrotomy; synovectomy (Right) - as a surgical intervention.  The patient's history has been reviewed, patient examined, no change in status, stable for surgery.  I have reviewed the patient's chart and labs.  Questions were answered to the patient's satisfaction.     Homero Fellers Cherre Kothari

## 2019-01-17 NOTE — Anesthesia Procedure Notes (Signed)
Procedure Name: LMA Insertion Date/Time: 01/17/2019 7:26 AM Performed by: Sindy Guadeloupe, CRNA Pre-anesthesia Checklist: Patient identified, Emergency Drugs available, Suction available, Patient being monitored and Timeout performed Patient Re-evaluated:Patient Re-evaluated prior to induction Oxygen Delivery Method: Circle system utilized Preoxygenation: Pre-oxygenation with 100% oxygen Induction Type: IV induction LMA: LMA inserted LMA Size: 4.0 Tube secured with: Tape Dental Injury: Teeth and Oropharynx as per pre-operative assessment

## 2019-01-18 ENCOUNTER — Encounter (HOSPITAL_COMMUNITY): Payer: Self-pay | Admitting: Orthopedic Surgery

## 2019-01-18 LAB — ACID FAST SMEAR (AFB, MYCOBACTERIA): Acid Fast Smear: NEGATIVE

## 2019-01-20 LAB — BODY FLUID CULTURE: Culture: NO GROWTH

## 2019-01-20 NOTE — Discharge Summary (Signed)
Physician Discharge Summary   Patient ID: Ariana Baird MRN: [id number] DOB/AGE: 1966-07-04 53 y.o.  Admit date: 01/17/2019 Discharge date: 01/17/2019  Primary Diagnosis: Right knee synovitis with possible septic arthritis   Admission Diagnoses:  Past Medical History:  Diagnosis Date  . Arthritis of left hand    swelling no pain or drainage x 3 months  . Complication of anesthesia   . GERD (gastroesophageal reflux disease)   . Neck complaint    right neck swollen area  no drainage x 3 months  . Septic arthritis (HCC)    synrovotis  . Stiff neck    x 3 months   Discharge Diagnoses:   Principal Problem:   Synovitis of knee  Estimated body mass index is 26.31 kg/m as calculated from the following:   Height as of this encounter: 5\' 6"  (1.676 m).   Weight as of this encounter: 73.9 kg.  Procedure:  Procedure(s) (LRB): Right knee arthrotomy; synovectomy (Right)   Consults: None  HPI: The patient is a 53 year old female with complex history in regards to her right knee.  She has had 2 arthroscopic procedures at an outside institution.  I saw her in second opinion several weeks ago.  I was concerned because of recurrent swelling and progressive arthritis that she potentially had a septic knee.  She had an aspiration x2 with equivocal results with a high white blood cell count and high neutrophils, but negative culture.  She is going to be a candidate for knee replacement, but given the fact that we cannot completely rule out infection, I felt that we needed a more substantial preoperative lab work.  She presents now for a synovectomy with cultures and pathologic analysis.  She also had an infectious disease consult.  Laboratory Data: Admission on 01/17/2019, Discharged on 01/17/2019  Component Date Value Ref Range Status  . Specimen Description 01/17/2019    Final                   Value:SYNOVIAL RIGHT KNEE Performed at [facility], [address],  [place], [place] [address]   . Special Requests 01/17/2019    Final                   Value:NONE Performed at [facility], [address] [place], [place] [address]   . Gram Stain 01/17/2019    Final                   Value:RARE WBC PRESENT,BOTH PMN AND MONONUCLEAR NO ORGANISMS SEEN   . Culture 01/17/2019    Final                   Value:NO GROWTH 3 DAYS Performed at [facility], [address], [place], [place] [address]   . Report Status 01/17/2019 01/20/2019 FINAL   Final  . Fungus Stain 01/17/2019 Final report   Final   Comment: (NOTE) Performed At: [facility] [address] [place], [place] [id number] Jolene Schimke MD QP:[id number]   . Fungus (Mycology) Culture 01/17/2019 PENDING   Incomplete  . Fungal Source 01/17/2019 SYNOVIAL   Final   Comment: RIGHT KNEE Performed at [facility], [address] [place], [place] [address]   . Fungus Stain 01/17/2019 Final report   Final   Comment: (NOTE) Performed At: [facility] [address] [place], [place] [id number] Jolene Schimke MD ES:[id number]   . Fungus (Mycology) Culture 01/17/2019 PENDING  Incomplete  . Fungal Source 01/17/2019 TISSUE   Final   Comment: RIGHT KNEE Performed at [facility], [address], [place], [place] [address]   . Specimen Description 01/17/2019    Final                   Value:TISSUE RIGHT KNEE Performed at [facility], [address], [place], [place] [address]   . Special Requests 01/17/2019    Final                   Value:NONE Performed at [facility], [address], [place], [place] [address]   . Gram Stain 01/17/2019    Final                   Value:RARE WBC PRESENT,BOTH PMN AND MONONUCLEAR NO ORGANISMS SEEN   . Culture 01/17/2019    Final                   Value:NO GROWTH 3 DAYS NO ANAEROBES ISOLATED; CULTURE IN PROGRESS FOR 5 DAYS Performed at [facility], [address] [place], [place] [address]   . Report Status 01/17/2019 PENDING   Incomplete  . AFB Specimen Processing 01/17/2019 Concentration   Final  . Acid Fast Smear 01/17/2019 Negative   Final   Comment: (NOTE) Performed At: [facility] [address] [place], [place] [id number] Jolene Schimke MD HO:[id number]   . Source (AFB) 01/17/2019 SYNOVIAL   Final   Comment: RIGHT KNEE Performed at [facility], [address] [place], [place] [address]   . Specimen Description 01/17/2019    Final                   Value:SYNOVIAL RIGHT KNEE Performed at [facility], [address], [place], [place] [address]   . Special Requests 01/17/2019    Final                   Value:NONE Performed at [facility], [address], [place], [place] [address]   . Culture 01/17/2019 NO ANAEROBES ISOLATED; CULTURE IN PROGRESS FOR 5 DAYS   Final  . Report Status 01/17/2019 PENDING   Incomplete  . Color, Synovial 01/17/2019 YELLOW  YELLOW Final  . Appearance-Synovial 01/17/2019 TURBID* CLEAR Final  . Crystals, Fluid 01/17/2019 NO CRYSTALS SEEN   Final  . WBC, Synovial 01/17/2019 47,560* 0 - 200 /cu mm Final  . Neutrophil, Synovial 01/17/2019 95* 0 - 25 % Final  . Lymphocytes-Synovial Fld 01/17/2019 2  0 - 20 % Final  . Monocyte-Macrophage-Synovial Fluid 01/17/2019 3* 50 - 90 % Final   Performed at [facility], [address] [place], [place] [address]  . Result 1 01/17/2019 Comment   Final   Comment: (NOTE) KOH/Calcofluor preparation:  no fungus observed. Performed At: [facility] [address] [place], [place] [id number] Jolene Schimke MD YZ:[id number]   . Result 1 01/17/2019 Comment   Final   Comment: (NOTE) KOH/Calcofluor preparation:  no fungus observed. Performed At: [facility] [address] [place], [place] [id number] Jolene Schimke MD GO:[id number]   Hospital  Outpatient Visit on 01/13/2019  Component Date Value Ref Range Status  . Sodium 01/13/2019 137  135 - 145 mmol/L Final  . Potassium 01/13/2019 3.7  3.5 - 5.1 mmol/L Final  . Chloride 01/13/2019 102  98 - 111 mmol/L Final  .  CO2 01/13/2019 26  22 - 32 mmol/L Final  . Glucose, Bld 01/13/2019 106* 70 - 99 mg/dL Final  . BUN 16/10/960403/19/2020 11  6 - 20 mg/dL Final  . Creatinine, Ser 01/13/2019 0.63  0.44 - 1.00 mg/dL Final  . Calcium 54/09/811903/19/2020 9.1  8.9 - 10.3 mg/dL Final  . Total Protein 01/13/2019 7.7  6.5 - 8.1 g/dL Final  . Albumin 14/78/295603/19/2020 4.1  3.5 - 5.0 g/dL Final  . AST 21/30/865703/19/2020 19  15 - 41 U/L Final  . ALT 01/13/2019 20  0 - 44 U/L Final  . Alkaline Phosphatase 01/13/2019 81  38 - 126 U/L Final  . Total Bilirubin 01/13/2019 0.4  0.3 - 1.2 mg/dL Final  . GFR calc non Af Amer 01/13/2019 >60  >60 mL/min Final  . GFR calc Af Amer 01/13/2019 >60  >60 mL/min Final  . Anion gap 01/13/2019 9  5 - 15 Final   Performed at [facility], [address] [place], [place] [address]  . WBC 01/13/2019 8.6  4.0 - 10.5 K/uL Final  . RBC 01/13/2019 4.05  3.87 - 5.11 MIL/uL Final  . Hemoglobin 01/13/2019 12.2  12.0 - 15.0 g/dL Final  . HCT 29/52/841303/19/2020 38.0  36.0 - 46.0 % Final  . MCV 01/13/2019 93.8  80.0 - 100.0 fL Final  . MCH 01/13/2019 30.1  26.0 - 34.0 pg Final  . MCHC 01/13/2019 32.1  30.0 - 36.0 g/dL Final  . RDW 24/40/102703/19/2020 12.5  11.5 - 15.5 % Final  . Platelets 01/13/2019 325  150 - 400 K/uL Final  . nRBC 01/13/2019 0.0  0.0 - 0.2 % Final   Performed at [facility], [address] [place], [place] [address]     X-Rays:No results found.  EKG:No orders found for this or any previous visit.   Hospital Course: Ariana PrimusKellie Baird is a 53 y.o. who was admitted to [facility]. They were brought to the operating room on 01/17/2019 and underwent Procedure(s): Right knee arthrotomy; synovectomy.  Patient tolerated the procedure well and was later  transferred to the recovery room and then to the orthopaedic floor for postoperative care. They were given PO and IV analgesics for pain control following their surgery. They were given 24 hours of postoperative antibiotics of  Anti-infectives (From admission, onward)   Start     Dose/Rate Route Frequency Ordered Stop   01/17/19 1400  ceFAZolin (ANCEF) IVPB 1 g/50 mL premix  Status:  Discontinued     1 g 100 mL/hr over 30 Minutes Intravenous Every 6 hours 01/17/19 0943 01/17/19 1736   01/17/19 0600  ceFAZolin (ANCEF) IVPB 2g/100 mL premix     2 g 200 mL/hr over 30 Minutes Intravenous On call to O.R. 01/17/19 [id number] 01/17/19 0727     and started on DVT prophylaxis in the form of Aspirin. Intraoperative cultures were taken to r/o infection. Patient was feeling well on the afternoon following surgery and was ready for discharge home. She was discharged in stable condition.  Diet: Regular diet Activity: WBAT Follow-up: in 2 weeks Disposition: Home Discharged Condition: stable   Discharge Instructions    Call MD / Call 911   Complete by:  As directed    If you experience chest pain or shortness of breath, CALL 911 and be transported to the hospital emergency room.  If you develope a fever above 101 F, pus (white drainage) or increased drainage or redness at the wound, or calf pain, call  your surgeon's office.   Constipation Prevention   Complete by:  As directed    Drink plenty of fluids.  Prune juice may be helpful.  You may use a stool softener, such as Colace (over the counter) 100 mg twice a day.  Use MiraLax (over the counter) for constipation as needed.   Diet - low sodium heart healthy   Complete by:  As directed    Weight bearing as tolerated   Complete by:  As directed      Allergies as of 01/17/2019      Reactions   Percocet [oxycodone-acetaminophen] Itching   Enoxaparin Rash   lovenox      Medication List    TAKE these medications   acetaminophen 500 MG tablet Commonly  known as:  TYLENOL Take 1,000 mg by mouth every 6 (six) hours as needed (for pain.).   aspirin EC 325 MG tablet Take 1 tablet (325 mg total) by mouth daily for 21 days. Then take one 81 mg aspirin once a day for three weeks. Then discontinue aspirin.   COSAMIN DS PO Take 1 tablet by mouth daily.   FISH OIL PO Take 720 mg by mouth daily.   HYDROcodone-acetaminophen 5-325 MG tablet Commonly known as:  NORCO/VICODIN Take 1-2 tablets by mouth every 6 (six) hours as needed for severe pain.   ibuprofen 200 MG tablet Commonly known as:  ADVIL,MOTRIN Take 600 mg by mouth every 8 (eight) hours as needed (for pain.).   Magnesium 250 MG Tabs Take 250 mg by mouth daily.   methocarbamol 500 MG tablet Commonly known as:  ROBAXIN Take 1 tablet (500 mg total) by mouth every 6 (six) hours as needed for muscle spasms.   traMADol 50 MG tablet Commonly known as:  Ultram Take 1-2 tablets (50-100 mg total) by mouth every 6 (six) hours as needed for moderate pain.   Turmeric Curcumin 500 MG Caps Take 500 mg by mouth daily.   Vitamin D-3 125 MCG (5000 UT) Tabs Take 5,000 Units by mouth daily.            Discharge Care Instructions  (From admission, onward)         Start     Ordered   01/17/19 0000  Weight bearing as tolerated     01/17/19 1318         Follow-up Information    Ollen Gross, MD. Schedule an appointment as soon as possible for a visit on 02/01/2019.   Specialty:  Orthopedic Surgery Contact information: [address] [place] [address] [place] [address] [phone]           Signed: Arther Abbott, PA-C Orthopedic Surgery 01/20/2019, 9:09 AM

## 2019-01-22 LAB — ANAEROBIC CULTURE

## 2019-01-22 LAB — AEROBIC/ANAEROBIC CULTURE W GRAM STAIN (SURGICAL/DEEP WOUND): Culture: NO GROWTH

## 2019-01-22 LAB — AEROBIC/ANAEROBIC CULTURE (SURGICAL/DEEP WOUND)

## 2019-02-15 LAB — FUNGUS CULTURE RESULT

## 2019-02-15 LAB — FUNGAL ORGANISM REFLEX

## 2019-02-15 LAB — FUNGUS CULTURE WITH STAIN

## 2019-03-02 LAB — ACID FAST CULTURE WITH REFLEXED SENSITIVITIES (MYCOBACTERIA): Acid Fast Culture: NEGATIVE
